# Patient Record
Sex: Female | Born: 1955 | Race: White | Hispanic: No | Marital: Married | Smoking: Never smoker
Health system: Southern US, Community
[De-identification: ages and names within clinical notes are randomized; demographics above are authoritative.]

## PROBLEM LIST (undated history)

## (undated) DIAGNOSIS — E785 Hyperlipidemia, unspecified: Secondary | ICD-10-CM

## (undated) DIAGNOSIS — K219 Gastro-esophageal reflux disease without esophagitis: Secondary | ICD-10-CM

## (undated) DIAGNOSIS — E119 Type 2 diabetes mellitus without complications: Secondary | ICD-10-CM

## (undated) DIAGNOSIS — A159 Respiratory tuberculosis unspecified: Secondary | ICD-10-CM

## (undated) DIAGNOSIS — I10 Essential (primary) hypertension: Secondary | ICD-10-CM

## (undated) DIAGNOSIS — M1990 Unspecified osteoarthritis, unspecified site: Secondary | ICD-10-CM

## (undated) DIAGNOSIS — E079 Disorder of thyroid, unspecified: Secondary | ICD-10-CM

## (undated) HISTORY — DX: Type 2 diabetes mellitus without complications: E11.9

## (undated) HISTORY — DX: Unspecified osteoarthritis, unspecified site: M19.90

## (undated) HISTORY — DX: Hyperlipidemia, unspecified: E78.5

## (undated) HISTORY — DX: Gastro-esophageal reflux disease without esophagitis: K21.9

## (undated) HISTORY — DX: Disorder of thyroid, unspecified: E07.9

## (undated) HISTORY — DX: Essential (primary) hypertension: I10

## (undated) HISTORY — DX: Respiratory tuberculosis unspecified: A15.9

## (undated) HISTORY — PX: TUBAL LIGATION: SHX77

---

## 2000-01-25 ENCOUNTER — Other Ambulatory Visit: Admission: RE | Admit: 2000-01-25 | Discharge: 2000-01-25 | Payer: Self-pay | Admitting: Obstetrics and Gynecology

## 2008-09-30 ENCOUNTER — Encounter: Admission: RE | Admit: 2008-09-30 | Discharge: 2008-09-30 | Payer: Self-pay | Admitting: Family Medicine

## 2010-01-12 DIAGNOSIS — J309 Allergic rhinitis, unspecified: Secondary | ICD-10-CM | POA: Insufficient documentation

## 2010-01-12 DIAGNOSIS — I071 Rheumatic tricuspid insufficiency: Secondary | ICD-10-CM | POA: Insufficient documentation

## 2010-01-19 ENCOUNTER — Encounter: Admission: RE | Admit: 2010-01-19 | Discharge: 2010-01-19 | Payer: Self-pay | Admitting: Family Medicine

## 2011-01-17 ENCOUNTER — Other Ambulatory Visit: Payer: Self-pay | Admitting: Family Medicine

## 2011-01-17 DIAGNOSIS — K219 Gastro-esophageal reflux disease without esophagitis: Secondary | ICD-10-CM | POA: Insufficient documentation

## 2011-01-17 DIAGNOSIS — E1142 Type 2 diabetes mellitus with diabetic polyneuropathy: Secondary | ICD-10-CM | POA: Insufficient documentation

## 2011-01-17 DIAGNOSIS — I10 Essential (primary) hypertension: Secondary | ICD-10-CM | POA: Insufficient documentation

## 2011-01-17 DIAGNOSIS — E039 Hypothyroidism, unspecified: Secondary | ICD-10-CM | POA: Insufficient documentation

## 2011-01-17 DIAGNOSIS — Z780 Asymptomatic menopausal state: Secondary | ICD-10-CM

## 2011-01-17 DIAGNOSIS — E119 Type 2 diabetes mellitus without complications: Secondary | ICD-10-CM | POA: Insufficient documentation

## 2011-01-17 DIAGNOSIS — Z1231 Encounter for screening mammogram for malignant neoplasm of breast: Secondary | ICD-10-CM

## 2011-01-26 ENCOUNTER — Other Ambulatory Visit: Payer: Self-pay

## 2011-01-26 ENCOUNTER — Ambulatory Visit: Payer: Self-pay

## 2011-01-31 ENCOUNTER — Ambulatory Visit
Admission: RE | Admit: 2011-01-31 | Discharge: 2011-01-31 | Disposition: A | Discharge status: Home / Self Care | Payer: PRIVATE HEALTH INSURANCE | Source: Ambulatory Visit | Attending: Family Medicine | Admitting: Family Medicine

## 2011-01-31 DIAGNOSIS — Z780 Asymptomatic menopausal state: Secondary | ICD-10-CM

## 2011-01-31 DIAGNOSIS — Z1231 Encounter for screening mammogram for malignant neoplasm of breast: Secondary | ICD-10-CM

## 2011-03-31 ENCOUNTER — Ambulatory Visit: Payer: PRIVATE HEALTH INSURANCE | Admitting: Family Medicine

## 2011-10-18 DIAGNOSIS — M19049 Primary osteoarthritis, unspecified hand: Secondary | ICD-10-CM | POA: Insufficient documentation

## 2012-01-19 ENCOUNTER — Other Ambulatory Visit: Payer: Self-pay | Admitting: Family Medicine

## 2012-01-19 DIAGNOSIS — Z1231 Encounter for screening mammogram for malignant neoplasm of breast: Secondary | ICD-10-CM

## 2012-02-02 ENCOUNTER — Ambulatory Visit: Payer: PRIVATE HEALTH INSURANCE

## 2012-02-13 ENCOUNTER — Ambulatory Visit
Admission: RE | Admit: 2012-02-13 | Discharge: 2012-02-13 | Disposition: A | Discharge status: Home / Self Care | Payer: PRIVATE HEALTH INSURANCE | Source: Ambulatory Visit | Attending: Family Medicine | Admitting: Family Medicine

## 2012-02-13 DIAGNOSIS — Z1231 Encounter for screening mammogram for malignant neoplasm of breast: Secondary | ICD-10-CM

## 2013-11-22 ENCOUNTER — Other Ambulatory Visit: Payer: Self-pay | Admitting: Family Medicine

## 2013-11-22 DIAGNOSIS — Z1231 Encounter for screening mammogram for malignant neoplasm of breast: Secondary | ICD-10-CM

## 2013-12-06 ENCOUNTER — Ambulatory Visit: Payer: PRIVATE HEALTH INSURANCE

## 2013-12-17 ENCOUNTER — Ambulatory Visit
Admission: RE | Admit: 2013-12-17 | Discharge: 2013-12-17 | Disposition: A | Discharge status: Home / Self Care | Payer: BC Managed Care – PPO | Source: Ambulatory Visit | Attending: Family Medicine | Admitting: Family Medicine

## 2013-12-17 DIAGNOSIS — Z1231 Encounter for screening mammogram for malignant neoplasm of breast: Secondary | ICD-10-CM

## 2016-03-25 ENCOUNTER — Other Ambulatory Visit: Payer: Self-pay | Admitting: Family Medicine

## 2016-03-25 DIAGNOSIS — Z1231 Encounter for screening mammogram for malignant neoplasm of breast: Secondary | ICD-10-CM

## 2016-03-25 DIAGNOSIS — M1711 Unilateral primary osteoarthritis, right knee: Secondary | ICD-10-CM | POA: Insufficient documentation

## 2016-03-25 DIAGNOSIS — M1712 Unilateral primary osteoarthritis, left knee: Secondary | ICD-10-CM | POA: Insufficient documentation

## 2016-04-08 ENCOUNTER — Ambulatory Visit: Payer: Self-pay

## 2016-04-15 ENCOUNTER — Ambulatory Visit: Payer: Self-pay

## 2016-04-20 ENCOUNTER — Ambulatory Visit: Payer: Self-pay

## 2016-04-28 ENCOUNTER — Ambulatory Visit
Admission: RE | Admit: 2016-04-28 | Discharge: 2016-04-28 | Disposition: A | Discharge status: Home / Self Care | Payer: BLUE CROSS/BLUE SHIELD | Source: Ambulatory Visit | Attending: Family Medicine | Admitting: Family Medicine

## 2016-04-28 DIAGNOSIS — Z1231 Encounter for screening mammogram for malignant neoplasm of breast: Secondary | ICD-10-CM

## 2017-04-19 ENCOUNTER — Other Ambulatory Visit: Payer: Self-pay | Admitting: Family Medicine

## 2017-04-19 DIAGNOSIS — Z1231 Encounter for screening mammogram for malignant neoplasm of breast: Secondary | ICD-10-CM

## 2017-10-20 LAB — LIPID PANEL
Cholesterol: 122 (ref 0–200)
HDL: 51 (ref 35–70)
LDL Cholesterol: 49
Triglycerides: 110 (ref 40–160)

## 2017-10-20 LAB — TSH: TSH: 2.45 (ref 0.41–5.90)

## 2017-10-20 LAB — BASIC METABOLIC PANEL: CREATININE: 0.8 (ref 0.5–1.1)

## 2017-10-24 LAB — MICROALBUMIN, URINE: MICROALB UR: NEGATIVE

## 2017-12-20 ENCOUNTER — Other Ambulatory Visit: Payer: Self-pay | Admitting: Nurse Practitioner

## 2017-12-20 DIAGNOSIS — Z1231 Encounter for screening mammogram for malignant neoplasm of breast: Secondary | ICD-10-CM

## 2018-01-08 ENCOUNTER — Ambulatory Visit: Payer: BLUE CROSS/BLUE SHIELD

## 2018-01-25 ENCOUNTER — Ambulatory Visit: Payer: Self-pay

## 2018-01-29 ENCOUNTER — Encounter: Payer: Self-pay | Admitting: Family Medicine

## 2018-01-29 ENCOUNTER — Other Ambulatory Visit: Payer: Self-pay

## 2018-01-29 ENCOUNTER — Ambulatory Visit (INDEPENDENT_AMBULATORY_CARE_PROVIDER_SITE_OTHER): Payer: 59 | Admitting: Family Medicine

## 2018-01-29 VITALS — BP 136/82 | HR 64 | Temp 98.3°F | Resp 16 | Ht 60.25 in | Wt 178.4 lb

## 2018-01-29 DIAGNOSIS — I10 Essential (primary) hypertension: Secondary | ICD-10-CM

## 2018-01-29 DIAGNOSIS — E039 Hypothyroidism, unspecified: Secondary | ICD-10-CM

## 2018-01-29 DIAGNOSIS — E119 Type 2 diabetes mellitus without complications: Secondary | ICD-10-CM

## 2018-01-29 DIAGNOSIS — E782 Mixed hyperlipidemia: Secondary | ICD-10-CM | POA: Diagnosis not present

## 2018-01-29 LAB — POCT GLYCOSYLATED HEMOGLOBIN (HGB A1C): HEMOGLOBIN A1C: 6.2

## 2018-01-29 MED ORDER — ROSUVASTATIN CALCIUM 10 MG PO TABS: 10.0000 mg | ORAL_TABLET | Freq: Every day | ORAL | 3 refills | Status: DC

## 2018-01-29 NOTE — Patient Instructions (Signed)
It was so good seeing you again! Thank you for establishing with my new practice and allowing me to continue caring for you. It means a lot to me.   Please schedule a follow up appointment with me in 6 months for Diabetic recheck.    Diabetes Mellitus and Nutrition When you have diabetes (diabetes mellitus), it is very important to have healthy eating habits because your blood sugar (glucose) levels are greatly affected by what you eat and drink. Eating healthy foods in the appropriate amounts, at about the same times every day, can help you:  Control your blood glucose.  Lower your risk of heart disease.  Improve your blood pressure.  Reach or maintain a healthy weight.  Every person with diabetes is different, and each person has different needs for a meal plan. Your health care provider may recommend that you work with a diet and nutrition specialist (dietitian) to make a meal plan that is best for you. Your meal plan may vary depending on factors such as:  The calories you need.  The medicines you take.  Your weight.  Your blood glucose, blood pressure, and cholesterol levels.  Your activity level.  Other health conditions you have, such as heart or kidney disease.  How do carbohydrates affect me? Carbohydrates affect your blood glucose level more than any other type of food. Eating carbohydrates naturally increases the amount of glucose in your blood. Carbohydrate counting is a method for keeping track of how many carbohydrates you eat. Counting carbohydrates is important to keep your blood glucose at a healthy level, especially if you use insulin or take certain oral diabetes medicines. It is important to know how many carbohydrates you can safely have in each meal. This is different for every person. Your dietitian can help you calculate how many carbohydrates you should have at each meal and for snack. Foods that contain carbohydrates include:  Bread, cereal, rice, pasta,  and crackers.  Potatoes and corn.  Peas, beans, and lentils.  Milk and yogurt.  Fruit and juice.  Desserts, such as cakes, cookies, ice cream, and candy.  How does alcohol affect me? Alcohol can cause a sudden decrease in blood glucose (hypoglycemia), especially if you use insulin or take certain oral diabetes medicines. Hypoglycemia can be a life-threatening condition. Symptoms of hypoglycemia (sleepiness, dizziness, and confusion) are similar to symptoms of having too much alcohol. If your health care provider says that alcohol is safe for you, follow these guidelines:  Limit alcohol intake to no more than 1 drink per day for nonpregnant women and 2 drinks per day for men. One drink equals 12 oz of beer, 5 oz of wine, or 1 oz of hard liquor.  Do not drink on an empty stomach.  Keep yourself hydrated with water, diet soda, or unsweetened iced tea.  Keep in mind that regular soda, juice, and other mixers may contain a lot of sugar and must be counted as carbohydrates.  What are tips for following this plan? Reading food labels  Start by checking the serving size on the label. The amount of calories, carbohydrates, fats, and other nutrients listed on the label are based on one serving of the food. Many foods contain more than one serving per package.  Check the total grams (g) of carbohydrates in one serving. You can calculate the number of servings of carbohydrates in one serving by dividing the total carbohydrates by 15. For example, if a food has 30 g of total carbohydrates, it  would be equal to 2 servings of carbohydrates.  Check the number of grams (g) of saturated and trans fats in one serving. Choose foods that have low or no amount of these fats.  Check the number of milligrams (mg) of sodium in one serving. Most people should limit total sodium intake to less than 2,300 mg per day.  Always check the nutrition information of foods labeled as "low-fat" or "nonfat". These  foods may be higher in added sugar or refined carbohydrates and should be avoided.  Talk to your dietitian to identify your daily goals for nutrients listed on the label. Shopping  Avoid buying canned, premade, or processed foods. These foods tend to be high in fat, sodium, and added sugar.  Shop around the outside edge of the grocery store. This includes fresh fruits and vegetables, bulk grains, fresh meats, and fresh dairy. Cooking  Use low-heat cooking methods, such as baking, instead of high-heat cooking methods like deep frying.  Cook using healthy oils, such as olive, canola, or sunflower oil.  Avoid cooking with butter, cream, or high-fat meats. Meal planning  Eat meals and snacks regularly, preferably at the same times every day. Avoid going long periods of time without eating.  Eat foods high in fiber, such as fresh fruits, vegetables, beans, and whole grains. Talk to your dietitian about how many servings of carbohydrates you can eat at each meal.  Eat 4-6 ounces of lean protein each day, such as lean meat, chicken, fish, eggs, or tofu. 1 ounce is equal to 1 ounce of meat, chicken, or fish, 1 egg, or 1/4 cup of tofu.  Eat some foods each day that contain healthy fats, such as avocado, nuts, seeds, and fish. Lifestyle   Check your blood glucose regularly.  Exercise at least 30 minutes 5 or more days each week, or as told by your health care provider.  Take medicines as told by your health care provider.  Do not use any products that contain nicotine or tobacco, such as cigarettes and e-cigarettes. If you need help quitting, ask your health care provider.  Work with a Veterinary surgeoncounselor or diabetes educator to identify strategies to manage stress and any emotional and social challenges. What are some questions to ask my health care provider?  Do I need to meet with a diabetes educator?  Do I need to meet with a dietitian?  What number can I call if I have questions?  When  are the best times to check my blood glucose? Where to find more information:  American Diabetes Association: diabetes.org/food-and-fitness/food  Academy of Nutrition and Dietetics: https://www.vargas.com/www.eatright.org/resources/health/diseases-and-conditions/diabetes  [employer] Institute of Diabetes and Digestive and Kidney Diseases (NIH): FindJewelers.czwww.niddk.nih.gov/health-information/diabetes/overview/diet-eating-physical-activity Summary  A healthy meal plan will help you control your blood glucose and maintain a healthy lifestyle.  Working with a diet and nutrition specialist (dietitian) can help you make a meal plan that is best for you.  Keep in mind that carbohydrates and alcohol have immediate effects on your blood glucose levels. It is important to count carbohydrates and to use alcohol carefully. This information is not intended to replace advice given to you by your health care provider. Make sure you discuss any questions you have with your health care provider. Document Released: 07/21/2005 Document Revised: 11/28/2016 Document Reviewed: 11/28/2016 Elsevier Interactive Patient Education  [employer]2018 Elsevier Inc.

## 2018-01-29 NOTE — Progress Notes (Signed)
Subjective  CC:  Chief Complaint  Patient presents with  . Establish Care    Check Hemoglobin A1c    HPI: Meagan Harrison is a 62 y.o. female who presents to [facility] today to establish care with me as a new patient. She is a former [facility] patient and is here to reestablish care with me today.  Last cpe 10/2017 - nl CMP, ldl at goa, and TSH at goal at that time. See media for labs  She has the following concerns or needs:  DM - well controlled and doing well. No concerns or complications. Admits to an occasional parasthesia in her feet at night w/o pain. No foot sores. No AEs from meds. Neg microalbuminuria. imms up to date: prevnar and pnuemovax  HTn f/u: doing fine on bb and ARB. No cp or sxs of chf. No AEs. Diet is fair. Weight is high but stable   Hyperlipidemia f/u: Patient presents for follow up of lipids. Lipids have been well controlled on meds w/o myalgias or side effects. Compliance with treatment thus far has been excellent. The patient does use medications that may worsen dyslipidemias (corticosteroids, progestins, anabolic steroids, diuretics, beta-blockers, amiodarone, cyclosporine, olanzapine). The patient exercises infrequently. The patient is not known to have coexisting coronary artery disease.     We updated and reviewed the patient's past history in detail and it is documented below.  Patient Active Problem List   Diagnosis Date Noted  . Mixed hyperlipidemia 01/29/2018    Priority: High  . Primary osteoarthritis of right knee 03/25/2016  . Osteoarthritis, hand 10/18/2011  . Diabetes mellitus type 2, controlled, without complications (HCC) 01/17/2011  . GERD (gastroesophageal reflux disease) 01/17/2011  . Essential hypertension 01/17/2011  . Acquired hypothyroidism 01/17/2011  . Allergic rhinitis 01/12/2010  . Tricuspid regurgitation 01/12/2010   Health Maintenance  Topic Date Due  . MAMMOGRAM  04/28/2017  . HIV Screening   01/30/2019 (Originally 02/22/1971)  . PNEUMOCOCCAL POLYSACCHARIDE VACCINE (2) 01/30/2024 (Originally 01/13/2015)  . OPHTHALMOLOGY EXAM  05/27/2018  . HEMOGLOBIN A1C  08/01/2018  . COLONOSCOPY  09/25/2018  . FOOT EXAM  01/30/2019  . PAP SMEAR  09/20/2019  . TETANUS/TDAP  01/18/2022  . INFLUENZA VACCINE  Completed  . Hepatitis C Screening  Completed   Immunization History  Administered Date(s) Administered  . Hepatitis B, ped/adol 02/28/1991  . Influenza Split 08/07/2000, 08/25/2011, 08/23/2015  . Influenza, Seasonal, Injecte, Preservative Fre 08/26/2014, 08/22/2015, 09/03/2016  . Influenza,inj,Quad PF,6+ Mos 08/25/2017  . PPD Test 11/08/2007  . Pneumococcal Conjugate-13 03/04/2014  . Pneumococcal Polysaccharide-23 01/12/2010  . Td 07/08/2005  . Tdap 01/19/2012   Current Meds  Medication Sig  . Calcium Citrate-Vitamin D 200-250 MG-UNIT TABS Take by mouth.  . cyclobenzaprine (FLEXERIL) 5 MG tablet TAKE 2 TABLETS BY MOUTH THREE TIMES DAILY AS NEEDED FOR MUSCLE SPASMS  . diclofenac sodium (VOLTAREN) 1 % GEL APPLY 1 GRAMS TO affected joints  FOUR TIMES DAILY AS DIRECTED  . famotidine (PEPCID) 20 MG tablet Take 20 mg by mouth 2 (two) times daily.  . fexofenadine (ALLEGRA) 60 MG tablet Take by mouth.  . fluticasone (FLONASE) 50 MCG/ACT nasal spray INSTILL 1 TO 2 SPRAYS IN EACH NOSTRIL ONCE TO TWICE DAILY  . glipiZIDE (GLUCOTROL XL) 2.5 MG 24 hr tablet TAKE 1 TABLET(2.5 MG) BY MOUTH DAILY  . glucosamine-chondroitin 500-400 MG tablet Take 2 tablets by mouth daily. 1500-200, Called Move ahead  . ibuprofen (ADVIL,MOTRIN) 200 MG tablet Take by mouth.  Marland Kitchen  levothyroxine (SYNTHROID, LEVOTHROID) 50 MCG tablet TAKE 1 TABLET BY MOUTH DAILY  . losartan (COZAAR) 100 MG tablet TAKE 1 TABLET BY MOUTH EVERY DAY  . metFORMIN (GLUCOPHAGE) 1000 MG tablet TAKE 1 TABLET(1000 MG) BY MOUTH TWICE DAILY WITH MEALS  . metoprolol tartrate (LOPRESSOR) 50 MG tablet TAKE 1 TABLET BY MOUTH TWICE DAILY  . montelukast  (SINGULAIR) 10 MG tablet TAKE 1 TABLET BY MOUTH EVERY NIGHT AT BEDTIME  . Multiple Vitamin (MULTIVITAMIN) tablet Take by mouth.  . rosuvastatin (CRESTOR) 10 MG tablet Take 1 tablet (10 mg total) by mouth at bedtime.  . [DISCONTINUED] rosuvastatin (CRESTOR) 10 MG tablet Take by mouth.    Allergies: Patient is allergic to lisinopril. Past Medical History Patient  has a past medical history of Arthritis, Diabetes mellitus (HCC), GERD (gastroesophageal reflux disease), Hyperlipidemia, Hypertension, Thyroid disease, and Tuberculosis. Past Surgical History Patient  has a past surgical history that includes Cesarean section. Family History: Patient family history includes Diabetes in her father; Hypertension in her father; Rectal cancer in her mother; Stroke in her father. Social History:  Patient  reports that she has never smoked. She has never used smokeless tobacco. She reports that she drinks alcohol. She reports that she does not use drugs.  Review of Systems: Constitutional: negative for fever or malaise Ophthalmic: negative for photophobia, double vision or loss of vision Cardiovascular: negative for chest pain, dyspnea on exertion, or new LE swelling Respiratory: negative for SOB or persistent cough Gastrointestinal: negative for abdominal pain, change in bowel habits or melena Genitourinary: negative for dysuria or gross hematuria Musculoskeletal: negative for new gait disturbance or muscular weakness Integumentary: negative for new or persistent rashes Neurological: negative for TIA or stroke symptoms Psychiatric: negative for SI or delusions Allergic/Immunologic: negative for hives  Patient Care Team    Relationship Specialty Notifications Start End  Willow Ora, MD PCP - General Family Medicine  01/29/18   Sallye Lat, MD Consulting Physician Ophthalmology  01/29/18   Marshell Levan    01/29/18    Comment: Dentist    Objective  Vitals: BP 136/82   Pulse 64   Temp  98.3 F (36.8 C) (Oral)   Resp 16   Ht 5' 0.25" (1.53 m)   Wt 178 lb 6.4 oz (80.9 kg)   SpO2 98%   BMI 34.55 kg/m  General:  Well developed, well nourished, no acute distress  Psych:  Alert and oriented,normal mood and affect HEENT:  Normocephalic, atraumatic, non-icteric sclera, PERRL, oropharynx is without mass or exudate, supple neck without adenopathy, mass or thyromegaly Cardiovascular:  RRR without gallop, rub or murmur, nondisplaced PMI Respiratory:  Good breath sounds bilaterally, CTAB with normal respiratory effort Gastrointestinal: normal bowel sounds, soft, non-tender, no noted masses. No HSM MSK: no deformities, contusions. Joints are without erythema or swelling Skin:  Warm, no rashes or suspicious lesions noted Neurologic:    Mental status is normal. Gross motor and sensory exams are normal. Normal gait Diabetic Foot Exam: Appearance - no lesions, ulcers or calluses Skin - no sigificant pallor or erythema Monofilament testing - sensitive bilaterally in following locations:  Right - Great toe, medial, central, lateral ball and posterior foot intact  Left - Great toe, medial, central, lateral ball and posterior foot intact Pulses - +2 distally bilaterally  Lab Results  Component Value Date   HGBA1C 6.2 01/29/2018    Office Visit on 01/29/2018  Component Date Value Ref Range Status  . Microalb, Ur 10/24/2017 neg   Final  .  Hemoglobin A1C 01/29/2018 6.2   Final  . Creatinine 10/20/2017 0.8  0.5 - 1.1 Final  . Triglycerides 10/20/2017 110  40 - 160 Final  . Cholesterol 10/20/2017 122  0 - 200 Final  . HDL 10/20/2017 51  35 - 70 Final  . LDL Cholesterol 10/20/2017 49   Final  . TSH 10/20/2017 2.45  0.41 - 5.90 Final    Assessment  1. Controlled type 2 diabetes mellitus without complication, without long-term current use of insulin (HCC)   2. Essential hypertension   3. Mixed hyperlipidemia   4. Acquired hypothyroidism      Plan   Diabetes: This medical  condition is well controlled. There are no signs of complications, medication side effects, or red flags. Patient is instructed to continue the current treatment plan without change in therapies or medications. May be developing mild peripheral neuropathy; will monitor. She does daily foot exam and care.   HTN is controlled as well. No med changes. Nl renal fxn and electrolytes.   Lipids are at goal. On crestor with last LDL 49. Nl liver function.   Thyroid is controlled. No med changes.   Follow up:  Return in about 6 months (around 08/01/2018) for follow up of diabetes and hypertension.  Commons side effects, risks, benefits, and alternatives for medications and treatment plan prescribed today were discussed, and the patient expressed understanding of the given instructions. Patient is instructed to call or message via MyChart if he/she has any questions or concerns regarding our treatment plan. No barriers to understanding were identified. We discussed Red Flag symptoms and signs in detail. Patient expressed understanding regarding what to do in case of urgent or emergency type symptoms.   Medication list was reconciled, printed and provided to the patient in AVS. Patient instructions and summary information was reviewed with the patient as documented in the AVS. This note was prepared with assistance of Dragon voice recognition software. Occasional wrong-word or sound-a-like substitutions may have occurred due to the inherent limitations of voice recognition software  Orders Placed This Encounter  Procedures  . Microalbumin, urine  . Basic metabolic panel  . Lipid panel  . TSH  . POCT glycosylated hemoglobin (Hb A1C)   Meds ordered this encounter  Medications  . rosuvastatin (CRESTOR) 10 MG tablet    Sig: Take 1 tablet (10 mg total) by mouth at bedtime.    Dispense:  90 tablet    Refill:  3

## 2018-02-13 ENCOUNTER — Encounter: Payer: Self-pay | Admitting: Family Medicine

## 2018-02-14 ENCOUNTER — Other Ambulatory Visit: Payer: Self-pay | Admitting: Emergency Medicine

## 2018-02-14 MED ORDER — LEVOTHYROXINE SODIUM 50 MCG PO TABS: 50.0000 ug | ORAL_TABLET | Freq: Every day | ORAL | 3 refills | Status: DC

## 2018-02-14 MED ORDER — METOPROLOL TARTRATE 50 MG PO TABS: 50.0000 mg | ORAL_TABLET | Freq: Two times a day (BID) | ORAL | 3 refills | Status: DC

## 2018-02-14 MED ORDER — METFORMIN HCL 1000 MG PO TABS: ORAL_TABLET | ORAL | 3 refills | Status: DC

## 2018-02-14 MED ORDER — LOSARTAN POTASSIUM 100 MG PO TABS: 100.0000 mg | ORAL_TABLET | Freq: Every day | ORAL | 3 refills | Status: DC

## 2018-02-14 MED ORDER — MONTELUKAST SODIUM 10 MG PO TABS: 10.0000 mg | ORAL_TABLET | Freq: Every day | ORAL | 3 refills | Status: DC

## 2018-02-14 MED ORDER — GLIPIZIDE ER 2.5 MG PO TB24: ORAL_TABLET | ORAL | 3 refills | Status: DC

## 2018-04-24 LAB — HM DIABETES EYE EXAM

## 2018-05-07 ENCOUNTER — Encounter: Payer: Self-pay | Admitting: Emergency Medicine

## 2018-08-01 ENCOUNTER — Encounter: Payer: Self-pay | Admitting: Family Medicine

## 2018-08-01 ENCOUNTER — Other Ambulatory Visit: Payer: Self-pay

## 2018-08-01 ENCOUNTER — Ambulatory Visit: Payer: 59 | Admitting: Family Medicine

## 2018-08-01 VITALS — BP 132/90 | HR 81 | Temp 98.2°F | Ht 60.25 in | Wt 180.6 lb

## 2018-08-01 DIAGNOSIS — E782 Mixed hyperlipidemia: Secondary | ICD-10-CM

## 2018-08-01 DIAGNOSIS — Z23 Encounter for immunization: Secondary | ICD-10-CM

## 2018-08-01 DIAGNOSIS — M65332 Trigger finger, left middle finger: Secondary | ICD-10-CM | POA: Diagnosis not present

## 2018-08-01 DIAGNOSIS — I10 Essential (primary) hypertension: Secondary | ICD-10-CM

## 2018-08-01 DIAGNOSIS — E119 Type 2 diabetes mellitus without complications: Secondary | ICD-10-CM | POA: Diagnosis not present

## 2018-08-01 LAB — COMPREHENSIVE METABOLIC PANEL
ALBUMIN: 4.3 g/dL (ref 3.5–5.2)
ALT: 18 U/L (ref 0–35)
AST: 15 U/L (ref 0–37)
Alkaline Phosphatase: 86 U/L (ref 39–117)
BUN: 15 mg/dL (ref 6–23)
CO2: 27 mEq/L (ref 19–32)
CREATININE: 0.8 mg/dL (ref 0.40–1.20)
Calcium: 9.5 mg/dL (ref 8.4–10.5)
Chloride: 101 mEq/L (ref 96–112)
GFR: 77.39 mL/min (ref >60.00)
Glucose, Bld: 145 mg/dL — ABNORMAL HIGH (ref 70–99)
Potassium: 4 mEq/L (ref 3.5–5.1)
SODIUM: 139 meq/L (ref 135–145)
TOTAL PROTEIN: 6.7 g/dL (ref 6.0–8.3)
Total Bilirubin: 0.5 mg/dL (ref 0.2–1.2)

## 2018-08-01 LAB — LIPID PANEL
Cholesterol: 116 mg/dL (ref 0–200)
HDL: 47.8 mg/dL (ref >39.00)
LDL CALC: 38 mg/dL (ref 0–99)
NONHDL: 68.01
Total CHOL/HDL Ratio: 2
Triglycerides: 148 mg/dL (ref 0.0–149.0)
VLDL: 29.6 mg/dL (ref 0.0–40.0)

## 2018-08-01 LAB — CBC WITH DIFFERENTIAL/PLATELET
Basophils Absolute: 0.1 10*3/uL (ref 0.0–0.1)
Basophils Relative: 1.1 % (ref 0.0–3.0)
EOS ABS: 0.2 10*3/uL (ref 0.0–0.7)
Eosinophils Relative: 2.6 % (ref 0.0–5.0)
HCT: 38.9 % (ref 36.0–46.0)
Hemoglobin: 13.5 g/dL (ref 12.0–15.0)
Lymphocytes Relative: 24.6 % (ref 12.0–46.0)
Lymphs Abs: 1.7 10*3/uL (ref 0.7–4.0)
MCHC: 34.7 g/dL (ref 30.0–36.0)
MCV: 97 fl (ref 78.0–100.0)
MONO ABS: 0.5 10*3/uL (ref 0.1–1.0)
Monocytes Relative: 6.7 % (ref 3.0–12.0)
Neutro Abs: 4.4 10*3/uL (ref 1.4–7.7)
Neutrophils Relative %: 65 % (ref 43.0–77.0)
Platelets: 271 10*3/uL (ref 150.0–400.0)
RBC: 4.01 Mil/uL (ref 3.87–5.11)
RDW: 12.9 % (ref 11.5–15.5)
WBC: 6.8 10*3/uL (ref 4.0–10.5)

## 2018-08-01 LAB — HEMOGLOBIN A1C: HEMOGLOBIN A1C: 6.9 % — AB (ref 4.6–6.5)

## 2018-08-01 LAB — TSH: TSH: 2.62 u[IU]/mL (ref 0.35–4.50)

## 2018-08-01 NOTE — Progress Notes (Signed)
Subjective  CC:  Chief Complaint  Patient presents with  . Diabetes    Last A1c 01/29/2018, doing well, wants flu shot today   . Hypertension  . Hyperlipidemia  . Hand Pain    middle finger is locking; types all day at work    HPI: Meagan Harrison is a 62 y.o. female who presents to the office today for follow up of diabetes and problems listed above in the chief complaint.   Diabetes follow up: Her diabetic control is reported as Worse.  She denies symptoms of hyperglycemia but admits that her diet is weight is up a few pounds.  Mild symptoms of peripheral neuropathy persist.  They are not bothersome.  They are occurring more often. She denies exertional CP or SOB or symptomatic hypoglycemia. She denies foot sores.  Hypertension: Denies chest pain or lower extremity tolerating medications.  Hyperlipidemia follow-up: Has been well controlled on statin.  No myalgias  Hand pain, has mild osteoarthritis.  Now with mild triggering intermittently with left middle finger.  Has used Voltaren gel with good results.  No trauma.  No red swollen joints.  Health maintenance: Due for influenza vaccination today.  Still needs to go get mammogram.  Needs to reschedule.  She has a number for appointment.  Assessment  1. Controlled type 2 diabetes mellitus without complication, without long-term current use of insulin (HCC)   2. Essential hypertension   3. Mixed hyperlipidemia   4. Trigger finger, left middle finger   5. Need for influenza vaccination      Plan   Diabetes has been well controlled.  Check cotesting not currently available today.  Will adjust medications A1c is elevated.  Diabetic diet.  Will treat.  Discussed management of mild peripheral neuropathy symptoms.  Foot care.  Normal exam today.  Update flu shot today.  Hypertension and hyperlipidemia follow-up: Well-controlled.  Recheck lab work today.  No change in medications at this time.  Health maintenance: Patient to  reschedule her mammogram.  Trigger finger: Education given.  Start massage, ice, Aleve x1 to 2 weeks.  Splint as tolerated.  Follow-up if worsening.  Follow up: Return in about 3 months (around 10/31/2018) for complete physical, follow up of diabetes and hypertension.. Orders Placed This Encounter  Procedures  . Flu Vaccine QUAD 36+ mos IM  . Hemoglobin A1c  . CBC with Differential/Platelet  . Comprehensive metabolic panel  . Lipid panel  . TSH   No orders of the defined types were placed in this encounter.     Immunization History  Administered Date(s) Administered  . Hepatitis B, ped/adol 02/28/1991  . Influenza Split 08/07/2000, 08/25/2011, 08/23/2015  . Influenza, Seasonal, Injecte, Preservative Fre 08/26/2014, 08/22/2015, 09/03/2016  . Influenza,inj,Quad PF,6+ Mos 08/25/2017, 08/01/2018  . PPD Test 11/08/2007  . Pneumococcal Conjugate-13 03/04/2014  . Pneumococcal Polysaccharide-23 01/12/2010  . Td 07/08/2005  . Tdap 01/19/2012    Diabetes Related Lab Review: Lab Results  Component Value Date   HGBA1C 6.2 01/29/2018    Lab Results  Component Value Date   MICROALBUR neg 10/24/2017   Lab Results  Component Value Date   CREATININE 0.8 10/20/2017   Lab Results  Component Value Date   CHOL 122 10/20/2017   Lab Results  Component Value Date   HDL 51 10/20/2017   Lab Results  Component Value Date   LDLCALC 49 10/20/2017   Lab Results  Component Value Date   TRIG 110 10/20/2017   No results found for: CHOLHDL  No results found for: LDLDIRECT The ASCVD Risk score Denman George DC Jr., et al., 2013) failed to calculate for the following reasons:   The valid total cholesterol range is 130 to 320 mg/dL I have reviewed the PMH, Fam and Soc history. Patient Active Problem List   Diagnosis Date Noted  . Mixed hyperlipidemia 01/29/2018    Priority: High  . Primary osteoarthritis of right knee 03/25/2016  . Osteoarthritis, hand 10/18/2011    Overview:  righ 1st MCP  joint   . Diabetes mellitus type 2, controlled, without complications (HCC) 01/17/2011  . GERD (gastroesophageal reflux disease) 01/17/2011  . Essential hypertension 01/17/2011  . Acquired hypothyroidism 01/17/2011  . Allergic rhinitis 01/12/2010    Overview:  10/1 IMO update   . Tricuspid regurgitation 01/12/2010    Overview:  Mild Tricuspid Regurgitation - echo at [facility]; no further eval needed     Social History: Patient  reports that she has never smoked. She has never used smokeless tobacco. She reports that she drinks alcohol. She reports that she does not use drugs.  Review of Systems: Ophthalmic: negative for eye pain, loss of vision or double vision Cardiovascular: negative for chest pain Respiratory: negative for SOB or persistent cough Gastrointestinal: negative for abdominal pain Genitourinary: negative for dysuria or gross hematuria MSK: negative for foot lesions Neurologic: negative for weakness or gait disturbance  Wt Readings from Last 3 Encounters:  08/01/18 180 lb 9.6 oz (81.9 kg)  01/29/18 178 lb 6.4 oz (80.9 kg)    Objective  Vitals: BP 132/90   Pulse 81   Temp 98.2 F (36.8 C)   Ht 5' 0.25" (1.53 m)   Wt 180 lb 9.6 oz (81.9 kg)   SpO2 98%   BMI 34.98 kg/m  General: well appearing, no acute distress  Psych:  Alert and oriented, normal mood and affect HEENT:  Normocephalic, atraumatic, moist mucous membranes, supple neck  Cardiovascular:  Nl S1 and S2, RRR without murmur, gallop or rub. no edema Respiratory:  Good breath sounds bilaterally, CTAB with normal effort, no rales Gastrointestinal: normal BS, soft, nontender Skin:  Warm, no rashes Neurologic:   Mental status is normal. normal gait Foot exam: no erythema, pallor, or cyanosis visible nl proprioception and sensation to monofilament testing bilaterally, +2 distal pulses bilaterally Musculoskeletal: Bilateral hands normal.  Left middle MCP with palpable nodule and mild triggering, mild  tenderness    Diabetic education: ongoing education regarding chronic disease management for diabetes was given today. We continue to reinforce the ABC's of diabetic management: A1c (<7 or 8 dependent upon patient), tight blood pressure control, and cholesterol management with goal LDL < 100 minimally. We discuss diet strategies, exercise recommendations, medication options and possible side effects. At each visit, we review recommended immunizations and preventive care recommendations for diabetics and stress that good diabetic control can prevent other problems. See below for this patient's data.    Commons side effects, risks, benefits, and alternatives for medications and treatment plan prescribed today were discussed, and the patient expressed understanding of the given instructions. Patient is instructed to call or message via MyChart if he/she has any questions or concerns regarding our treatment plan. No barriers to understanding were identified. We discussed Red Flag symptoms and signs in detail. Patient expressed understanding regarding what to do in case of urgent or emergency type symptoms.   Medication list was reconciled, printed and provided to the patient in AVS. Patient instructions and summary information was reviewed with the patient as documented  in the AVS. This note was prepared with assistance of Dragon voice recognition software. Occasional wrong-word or sound-a-like substitutions may have occurred due to the inherent limitations of voice recognition software

## 2018-08-01 NOTE — Patient Instructions (Addendum)
Please return in 3 months for diabetes follow up and for your annual complete physical; please come fasting.  I will release your lab results to you on your MyChart account with further instructions. Please reply with any questions.  Improve your diet!  Wear the finger splint as needed, massage, ice and try alleve twice a day for 1-2 weeks. If it is worsening, return for a steroid injection.  If you have any questions or concerns, please don't hesitate to send me a message via MyChart or call the office at [phone]. Thank you for visiting with Meagan Harrison today! It's our pleasure caring for you.   Trigger Finger Trigger finger (stenosing tenosynovitis) is a condition that causes a finger to get stuck in a bent position. Each finger has a tough, cord-like tissue that connects muscle to bone (tendon), and each tendon is surrounded by a tunnel of tissue (tendon sheath). To move your finger, your tendon needs to slide freely through the sheath. Trigger finger happens when the tendon or the sheath thickens, making it difficult to move your finger. Trigger finger can affect any finger or a thumb. It may affect more than one finger. Mild cases may clear up with rest and medicine. Severe cases require more treatment. What are the causes? Trigger finger is caused by a thickened finger tendon or tendon sheath. The cause of this thickening is not known. What increases the risk? The following factors may make you more likely to develop this condition:  Doing activities that require a strong grip.  Having rheumatoid arthritis, gout, or diabetes.  Being 31-38 years old.  Being a woman.  What are the signs or symptoms? Symptoms of this condition include:  Pain when bending or straightening your finger.  Tenderness or swelling where your finger attaches to the palm of your hand.  A lump in the palm of your hand or on the inside of your finger.  Hearing a popping sound when you try to straighten your  finger.  Feeling a popping, catching, or locking sensation when you try to straighten your finger.  Being unable to straighten your finger.  How is this diagnosed? This condition is diagnosed based on your symptoms and a physical exam. How is this treated? This condition may be treated by:  Resting your finger and avoiding activities that make symptoms worse.  Wearing a finger splint to keep your finger in a slightly bent position.  Taking NSAIDs to relieve pain and swelling.  Injecting medicine (steroids) into the tendon sheath to reduce swelling and irritation. Injections may need to be repeated.  Having surgery to open the tendon sheath. This may be done if other treatments do not work and you cannot straighten your finger. You may need physical therapy after surgery.  Follow these instructions at home:  Use moist heat to help reduce pain and swelling as told by your health care provider.  Rest your finger and avoid activities that make pain worse. Return to normal activities as told by your health care provider.  If you have a splint, wear it as told by your health care provider.  Take over-the-counter and prescription medicines only as told by your health care provider.  Keep all follow-up visits as told by your health care provider. This is important. Contact a health care provider if:  Your symptoms are not improving with home care. Summary  Trigger finger (stenosing tenosynovitis) causes your finger to get stuck in a bent position, and it can make it difficult and  painful to straighten your finger.  This condition develops when a finger tendon or tendon sheath thickens.  Treatment starts with resting, wearing a splint, and taking NSAIDs.  In severe cases, surgery to open the tendon sheath may be needed. This information is not intended to replace advice given to you by your health care provider. Make sure you discuss any questions you have with your health care  provider. Document Released: 08/13/2004 Document Revised: 10/04/2016 Document Reviewed: 10/04/2016 Elsevier Interactive Patient Education  2017 [employer].

## 2018-09-17 ENCOUNTER — Other Ambulatory Visit: Payer: Self-pay | Admitting: Family Medicine

## 2018-09-17 NOTE — Telephone Encounter (Signed)
Received and reviewed medication refill request.  Request is appropriate and was approved.  Please see medication orders for details.  

## 2018-10-18 ENCOUNTER — Other Ambulatory Visit: Payer: Self-pay | Admitting: Family Medicine

## 2018-11-02 ENCOUNTER — Encounter: Payer: 59 | Admitting: Family Medicine

## 2018-11-14 ENCOUNTER — Other Ambulatory Visit: Payer: Self-pay

## 2018-11-14 ENCOUNTER — Other Ambulatory Visit (HOSPITAL_COMMUNITY)
Admission: RE | Admit: 2018-11-14 | Discharge: 2018-11-14 | Disposition: A | Discharge status: Home / Self Care | Payer: 59 | Source: Ambulatory Visit | Attending: Family Medicine | Admitting: Family Medicine

## 2018-11-14 ENCOUNTER — Encounter: Payer: Self-pay | Admitting: Family Medicine

## 2018-11-14 ENCOUNTER — Ambulatory Visit (INDEPENDENT_AMBULATORY_CARE_PROVIDER_SITE_OTHER): Payer: 59 | Admitting: Family Medicine

## 2018-11-14 VITALS — BP 136/80 | HR 88 | Temp 98.9°F | Resp 16 | Ht 60.0 in | Wt 178.4 lb

## 2018-11-14 DIAGNOSIS — Z23 Encounter for immunization: Secondary | ICD-10-CM

## 2018-11-14 DIAGNOSIS — I071 Rheumatic tricuspid insufficiency: Secondary | ICD-10-CM

## 2018-11-14 DIAGNOSIS — I10 Essential (primary) hypertension: Secondary | ICD-10-CM

## 2018-11-14 DIAGNOSIS — Z1239 Encounter for other screening for malignant neoplasm of breast: Secondary | ICD-10-CM

## 2018-11-14 DIAGNOSIS — E119 Type 2 diabetes mellitus without complications: Secondary | ICD-10-CM | POA: Diagnosis not present

## 2018-11-14 DIAGNOSIS — E782 Mixed hyperlipidemia: Secondary | ICD-10-CM

## 2018-11-14 DIAGNOSIS — Z124 Encounter for screening for malignant neoplasm of cervix: Secondary | ICD-10-CM | POA: Insufficient documentation

## 2018-11-14 DIAGNOSIS — E039 Hypothyroidism, unspecified: Secondary | ICD-10-CM

## 2018-11-14 DIAGNOSIS — Z0000 Encounter for general adult medical examination without abnormal findings: Secondary | ICD-10-CM | POA: Diagnosis not present

## 2018-11-14 LAB — CBC WITH DIFFERENTIAL/PLATELET
Basophils Absolute: 0 10*3/uL (ref 0.0–0.1)
Basophils Relative: 0.7 % (ref 0.0–3.0)
EOS PCT: 1.9 % (ref 0.0–5.0)
Eosinophils Absolute: 0.1 10*3/uL (ref 0.0–0.7)
HCT: 39.3 % (ref 36.0–46.0)
HEMOGLOBIN: 13.4 g/dL (ref 12.0–15.0)
Lymphocytes Relative: 27.6 % (ref 12.0–46.0)
Lymphs Abs: 1.6 10*3/uL (ref 0.7–4.0)
MCHC: 34 g/dL (ref 30.0–36.0)
MCV: 99 fl (ref 78.0–100.0)
MONOS PCT: 6.5 % (ref 3.0–12.0)
Monocytes Absolute: 0.4 10*3/uL (ref 0.1–1.0)
Neutro Abs: 3.8 10*3/uL (ref 1.4–7.7)
Neutrophils Relative %: 63.3 % (ref 43.0–77.0)
Platelets: 266 10*3/uL (ref 150.0–400.0)
RBC: 3.97 Mil/uL (ref 3.87–5.11)
RDW: 13 % (ref 11.5–15.5)
WBC: 5.9 10*3/uL (ref 4.0–10.5)

## 2018-11-14 LAB — LIPID PANEL
Cholesterol: 116 mg/dL (ref 0–200)
HDL: 47 mg/dL (ref >39.00)
LDL Cholesterol: 47 mg/dL (ref 0–99)
NonHDL: 69.4
Total CHOL/HDL Ratio: 2
Triglycerides: 113 mg/dL (ref 0.0–149.0)
VLDL: 22.6 mg/dL (ref 0.0–40.0)

## 2018-11-14 LAB — POCT GLYCOSYLATED HEMOGLOBIN (HGB A1C): Hemoglobin A1C: 6.2 % — AB (ref 4.0–5.6)

## 2018-11-14 LAB — COMPREHENSIVE METABOLIC PANEL
ALT: 18 U/L (ref 0–35)
AST: 20 U/L (ref 0–37)
Albumin: 4.2 g/dL (ref 3.5–5.2)
Alkaline Phosphatase: 62 U/L (ref 39–117)
BUN: 17 mg/dL (ref 6–23)
CO2: 27 mEq/L (ref 19–32)
Calcium: 9.8 mg/dL (ref 8.4–10.5)
Chloride: 102 mEq/L (ref 96–112)
Creatinine, Ser: 0.88 mg/dL (ref 0.40–1.20)
GFR: 69.27 mL/min (ref >60.00)
Glucose, Bld: 123 mg/dL — ABNORMAL HIGH (ref 70–99)
POTASSIUM: 4.1 meq/L (ref 3.5–5.1)
Sodium: 140 mEq/L (ref 135–145)
Total Bilirubin: 0.5 mg/dL (ref 0.2–1.2)
Total Protein: 6.8 g/dL (ref 6.0–8.3)

## 2018-11-14 LAB — MICROALBUMIN / CREATININE URINE RATIO
Creatinine,U: 48.1 mg/dL
Microalb Creat Ratio: 1.5 mg/g (ref 0.0–30.0)
Microalb, Ur: <0.7 mg/dL (ref 0.0–1.9)

## 2018-11-14 LAB — TSH: TSH: 2.66 u[IU]/mL (ref 0.35–4.50)

## 2018-11-14 MED ORDER — METOPROLOL TARTRATE 50 MG PO TABS: 50.0000 mg | ORAL_TABLET | Freq: Two times a day (BID) | ORAL | 3 refills | Status: DC

## 2018-11-14 MED ORDER — METFORMIN HCL 1000 MG PO TABS: ORAL_TABLET | ORAL | 0 refills | Status: DC

## 2018-11-14 NOTE — Patient Instructions (Addendum)
Please return in 6 months for diabetes and blood pressure follow up.   I will release your lab results to you on your MyChart account with further instructions. Please reply with any questions.  Your diabetes is doing well!  Please call Dr. Rogers Seeds office to set up your colonoscopy.  [facility]    If you have any questions or concerns, please don't hesitate to send me a message via MyChart or call the office at [phone]. Thank you for visiting with [place] today! It's our pleasure caring for you.  Please do these things to maintain good health!   Exercise at least 30-45 minutes a day,  4-5 days a week.   Eat a low-fat diet with lots of fruits and vegetables, up to 7-9 servings per day.  Drink plenty of water daily. Try to drink 8 8oz glasses per day.  Seatbelts can save your life. Always wear your seatbelt.  Place Smoke Detectors on every level of your home and check batteries every year.  Schedule an appointment with an eye doctor for an eye exam every 1-2 years  Safe sex - use condoms to protect yourself from STDs if you could be exposed to these types of infections. Use birth control if you do not want to become pregnant and are sexually active.  Avoid heavy alcohol use. If you drink, keep it to less than 2 drinks/day and not every day.  Health Care Power of Attorney.  Choose someone you trust that could speak for you if you became unable to speak for yourself.  Depression is common in our stressful world.If you're feeling down or losing interest in things you normally enjoy, please come in for a visit.  If anyone is threatening or hurting you, please get help. Physical or Emotional Violence is never OK.

## 2018-11-14 NOTE — Progress Notes (Signed)
Subjective  Chief Complaint  Patient presents with  . Annual Exam  . Diabetes  . Hypertension    HPI: Meagan Harrison is a 63 y.o. female who presents to [facility] today for a Female Wellness Visit. She also has the concerns and/or needs as listed above in the chief complaint. These will be addressed in addition to the Health Maintenance Visit.   Wellness Visit: annual visit with health maintenance review and exam without Pap   HM: due mammo; due colonoscopy. Pap due in November. imms up to date. shingrix candidate.  Diet: Eats a healthy diet.  Would like to lose weight.  Discussed low-fat diet.   Chronic disease f/u and/or acute problem visit: (deemed necessary to be done in addition to the wellness visit):  Hypertension:Feeling well. Taking medications w/o adverse effects. No symptoms of CHF, angina; no palpitations, sob, cp or lower extremity edema. Compliant with meds.   Diabetes follow up: Her diabetic control is reported as Improved.  Eating well.  No symptoms of hyperglycemia.  Tolerating medications.  Needs refill on metformin.  Eye exam is up-to-date.  Immunizations are up-to-date. She denies exertional CP or SOB or symptomatic hypoglycemia. She denies foot sores or paresthesias.  Hyperlipidemia on statin.  Due for fasting lipids.  Has been very well controlled.  No myalgias  Acquired hypothyroidism: Euthyroid clinically.  Compliant with medications. Immunization History  Administered Date(s) Administered  . Hepatitis B, ped/adol 02/28/1991  . Influenza Split 08/07/2000, 08/25/2011, 08/23/2015  . Influenza, Seasonal, Injecte, Preservative Fre 08/26/2014, 08/22/2015, 09/03/2016  . Influenza,inj,Quad PF,6+ Mos 08/25/2017, 08/01/2018  . PPD Test 11/08/2007  . Pneumococcal Conjugate-13 03/04/2014  . Pneumococcal Polysaccharide-23 01/12/2010  . Td 07/08/2005  . Tdap 01/19/2012    Diabetes Related Lab Review: Lab Results  Component Value  Date   HGBA1C 6.2 (A) 11/14/2018   HGBA1C 6.9 (H) 08/01/2018   HGBA1C 6.2 01/29/2018     Lab Results  Component Value Date   MICROALBUR neg 10/24/2017   Lab Results  Component Value Date   CREATININE 0.80 08/01/2018   BUN 15 08/01/2018   NA 139 08/01/2018   K 4.0 08/01/2018   CL 101 08/01/2018   CO2 27 08/01/2018   Lab Results  Component Value Date   CHOL 116 08/01/2018   CHOL 122 10/20/2017   Lab Results  Component Value Date   HDL 47.80 08/01/2018   HDL 51 10/20/2017   Lab Results  Component Value Date   LDLCALC 38 08/01/2018   LDLCALC 49 10/20/2017   Lab Results  Component Value Date   TRIG 148.0 08/01/2018   TRIG 110 10/20/2017   Lab Results  Component Value Date   CHOLHDL 2 08/01/2018   No results found for: LDLDIRECT The ASCVD Risk score Denman George DC Jr., et al., 2013) failed to calculate for the following reasons:   The valid total cholesterol range is 130 to 320 mg/dL  Assessment  1. Annual physical exam   2. Controlled type 2 diabetes mellitus without complication, without long-term current use of insulin (HCC)   3. Mixed hyperlipidemia   4. Essential hypertension   5. Acquired hypothyroidism   6. Tricuspid valve insufficiency, unspecified etiology      Plan  Female Wellness Visit:  Age appropriate Health Maintenance and Prevention measures were discussed with patient. Included topics are cancer screening recommendations, ways to keep healthy (see AVS) including dietary and exercise recommendations, regular eye and dental care, use of seat belts, and avoidance  of moderate alcohol use and tobacco use.  Pap smear with high-risk HPV testing done today.  Reordered mammogram encouraged patient to get this done.  Eye exam is up-to-date.  Patient to call [facility] office to set up colonoscopy.  BMI: discussed patient's BMI and encouraged positive lifestyle modifications to help get to or maintain a target BMI.  HM needs and immunizations were addressed and  ordered. See below for orders. See HM and immunization section for updates.  First Shingrix given today, will be due for second in the next 2 to 6 months.  Routine labs and screening tests ordered including cmp, cbc and lipids where appropriate.  Discussed recommendations regarding Vit D and calcium supplementation (see AVS)  Chronic disease management visit and/or acute problem visit:  Diabetes is well controlled.  Diabetic care check list is up-to-date.  Continue current medications.  Recheck 6 months.  Hypertension is well controlled.  Refill metoprolol.  Continue ARB.  Hyperlipidemia: Recheck fasting today.  Continue statin.  Check LFTs.  Hypothyroidism: Recheck today.  Continue medications.  Clinically euthyroid  Follow up: Return in about 3 months (around 02/13/2019) for follow up of diabetes and hypertension.  Orders Placed This Encounter  Procedures  . CBC with Differential/Platelet  . Comprehensive metabolic panel  . Lipid panel  . TSH  . Microalbumin / creatinine urine ratio  . HIV Antibody (routine testing w rflx)   No orders of the defined types were placed in this encounter.     Lifestyle: Body mass index is 34.84 kg/m. Wt Readings from Last 3 Encounters:  11/14/18 178 lb 6.4 oz (80.9 kg)  08/01/18 180 lb 9.6 oz (81.9 kg)  01/29/18 178 lb 6.4 oz (80.9 kg)    Patient Active Problem List   Diagnosis Date Noted  . Mixed hyperlipidemia 01/29/2018    Priority: High  . Primary osteoarthritis of right knee 03/25/2016  . Osteoarthritis, hand 10/18/2011    Overview:  righ 1st MCP joint   . Diabetes mellitus type 2, controlled, without complications (HCC) 01/17/2011  . GERD (gastroesophageal reflux disease) 01/17/2011  . Essential hypertension 01/17/2011  . Acquired hypothyroidism 01/17/2011  . Allergic rhinitis 01/12/2010    Overview:  10/1 IMO update   . Tricuspid regurgitation 01/12/2010    Overview:  Mild Tricuspid Regurgitation [facility]; no  further eval needed    Health Maintenance  Topic Date Due  . MAMMOGRAM  04/28/2017  . COLONOSCOPY  09/25/2018  . HIV Screening  01/30/2019 (Originally 02/22/1971)  . FOOT EXAM  01/30/2019  . HEMOGLOBIN A1C  01/30/2019  . OPHTHALMOLOGY EXAM  04/25/2019  . PAP SMEAR-Modifier  09/20/2019  . TETANUS/TDAP  01/18/2022  . INFLUENZA VACCINE  Completed  . PNEUMOCOCCAL POLYSACCHARIDE VACCINE AGE 19-64 HIGH RISK  Completed  . Hepatitis C Screening  Completed   Immunization History  Administered Date(s) Administered  . Hepatitis B, ped/adol 02/28/1991  . Influenza Split 08/07/2000, 08/25/2011, 08/23/2015  . Influenza, Seasonal, Injecte, Preservative Fre 08/26/2014, 08/22/2015, 09/03/2016  . Influenza,inj,Quad PF,6+ Mos 08/25/2017, 08/01/2018  . PPD Test 11/08/2007  . Pneumococcal Conjugate-13 03/04/2014  . Pneumococcal Polysaccharide-23 01/12/2010  . Td 07/08/2005  . Tdap 01/19/2012   We updated and reviewed the patient's past history in detail and it is documented below. Allergies: Patient is allergic to lisinopril. Past Medical History Patient  has a past medical history of Arthritis, Diabetes mellitus (HCC), GERD (gastroesophageal reflux disease), Hyperlipidemia, Hypertension, Thyroid disease, and Tuberculosis. Past Surgical History Patient  has a past surgical  history that includes Cesarean section. Family History: Patient family history includes Arthritis in her maternal grandmother and mother; Diabetes in her father; Healthy in her daughter and daughter; Hypertension in her father; Rectal cancer in her mother; Stroke in her father and maternal grandfather. Social History:  Patient  reports that she has never smoked. She has never used smokeless tobacco. She reports current alcohol use. She reports that she does not use drugs.  Review of Systems: Constitutional: negative for fever or malaise Ophthalmic: negative for photophobia, double vision or loss of vision Cardiovascular:  negative for chest pain, dyspnea on exertion, or new LE swelling Respiratory: negative for SOB or persistent cough Gastrointestinal: negative for abdominal pain, change in bowel habits or melena Genitourinary: negative for dysuria or gross hematuria, no abnormal uterine bleeding or disharge Musculoskeletal: negative for new gait disturbance or muscular weakness Integumentary: negative for new or persistent rashes, no breast lumps Neurological: negative for TIA or stroke symptoms Psychiatric: negative for SI or delusions Allergic/Immunologic: negative for hives  Patient Care Team    Relationship Specialty Notifications Start End  Willow Ora, MD PCP - General Family Medicine  01/29/18   Sallye Lat, MD Consulting Physician Ophthalmology  01/29/18   Marshell Levan    01/29/18    Comment: Dentist   Wt Readings from Last 3 Encounters:  11/14/18 178 lb 6.4 oz (80.9 kg)  08/01/18 180 lb 9.6 oz (81.9 kg)  01/29/18 178 lb 6.4 oz (80.9 kg)   BP Readings from Last 3 Encounters:  11/14/18 136/80  08/01/18 132/90  01/29/18 136/82    Objective  Vitals: BP 136/80   Pulse 88   Temp 98.9 F (37.2 C) (Oral)   Resp 16   Ht 5' (1.524 m)   Wt 178 lb 6.4 oz (80.9 kg)   SpO2 99%   BMI 34.84 kg/m  General:  Well developed, well nourished, no acute distress  Psych:  Alert and orientedx3,normal mood and affect HEENT:  Normocephalic, atraumatic, non-icteric sclera, PERRL, oropharynx is clear without mass or exudate, supple neck without adenopathy, mass or thyromegaly Cardiovascular:  Normal S1, S2, RRR without gallop, rub or murmur, nondisplaced PMI Respiratory:  Good breath sounds bilaterally, CTAB with normal respiratory effort Gastrointestinal: normal bowel sounds, soft, non-tender, no noted masses. No HSM MSK: no deformities, contusions. Joints are without erythema or swelling. Spine and CVA region are nontender Skin:  Warm, no rashes or suspicious lesions noted Neurologic:    Mental  status is normal. CN 2-11 are normal. Gross motor and sensory exams are normal. Normal gait. No tremor Breast Exam: No mass, skin retraction or nipple discharge is appreciated in either breast. No axillary adenopathy. Fibrocystic changes are not noted Pelvic Exam: Normal external genitalia, no vulvar or vaginal lesions present.  Atrophic, cervical stenosis present, clear cervix w/o CMT. Bimanual exam reveals a nontender fundus w/o masses, nl size. No adnexal masses present. No inguinal adenopathy. A PAP smear was performed.  Diabetic Foot Exam: Appearance - no lesions, ulcers or calluses Skin - no sigificant pallor or erythema Monofilament testing - sensitive bilaterally in following locations:  Right - Great toe, medial, central, lateral ball and posterior foot intact  Left - Great toe, medial, central, lateral ball and posterior foot intact Pulses - +2 distally bilaterally    Commons side effects, risks, benefits, and alternatives for medications and treatment plan prescribed today were discussed, and the patient expressed understanding of the given instructions. Patient is instructed to call or message via MyChart  if he/she has any questions or concerns regarding our treatment plan. No barriers to understanding were identified. We discussed Red Flag symptoms and signs in detail. Patient expressed understanding regarding what to do in case of urgent or emergency type symptoms.   Medication list was reconciled, printed and provided to the patient in AVS. Patient instructions and summary information was reviewed with the patient as documented in the AVS. This note was prepared with assistance of Dragon voice recognition software. Occasional wrong-word or sound-a-like substitutions may have occurred due to the inherent limitations of voice recognition software

## 2018-11-14 NOTE — Addendum Note (Signed)
Addended by: Erenest Blank on: 11/14/2018 09:20 AM   Modules accepted: Orders

## 2018-11-15 LAB — HIV ANTIBODY (ROUTINE TESTING W REFLEX): HIV 1&2 Ab, 4th Generation: NONREACTIVE

## 2018-11-22 LAB — CYTOLOGY - PAP
Diagnosis: NEGATIVE
HPV 16/18/45 genotyping: NEGATIVE
HPV: DETECTED — AB

## 2018-11-22 NOTE — Progress Notes (Signed)
Please call patient: I have reviewed his/her lab results. Pap is normal. No HR HPV. So no need to repeat for 5 years. Thanks.

## 2018-11-23 ENCOUNTER — Encounter: Payer: Self-pay | Admitting: *Deleted

## 2019-02-02 ENCOUNTER — Other Ambulatory Visit: Payer: Self-pay | Admitting: Family Medicine

## 2019-02-26 ENCOUNTER — Other Ambulatory Visit: Payer: Self-pay | Admitting: Family Medicine

## 2019-03-05 ENCOUNTER — Other Ambulatory Visit: Payer: Self-pay | Admitting: Family Medicine

## 2019-03-17 ENCOUNTER — Other Ambulatory Visit: Payer: Self-pay | Admitting: Family Medicine

## 2019-05-07 ENCOUNTER — Ambulatory Visit (INDEPENDENT_AMBULATORY_CARE_PROVIDER_SITE_OTHER): Payer: 59

## 2019-05-07 ENCOUNTER — Other Ambulatory Visit: Payer: Self-pay

## 2019-05-07 ENCOUNTER — Ambulatory Visit: Payer: 59 | Admitting: Family Medicine

## 2019-05-07 ENCOUNTER — Encounter: Payer: Self-pay | Admitting: Family Medicine

## 2019-05-07 ENCOUNTER — Other Ambulatory Visit: Payer: Self-pay | Admitting: Family Medicine

## 2019-05-07 VITALS — BP 136/78 | HR 68 | Temp 98.3°F | Resp 16 | Ht 60.0 in | Wt 182.2 lb

## 2019-05-07 DIAGNOSIS — Z23 Encounter for immunization: Secondary | ICD-10-CM

## 2019-05-07 DIAGNOSIS — M7581 Other shoulder lesions, right shoulder: Secondary | ICD-10-CM | POA: Diagnosis not present

## 2019-05-07 DIAGNOSIS — I10 Essential (primary) hypertension: Secondary | ICD-10-CM

## 2019-05-07 DIAGNOSIS — E782 Mixed hyperlipidemia: Secondary | ICD-10-CM

## 2019-05-07 DIAGNOSIS — E119 Type 2 diabetes mellitus without complications: Secondary | ICD-10-CM | POA: Diagnosis not present

## 2019-05-07 MED ORDER — MELOXICAM 7.5 MG PO TABS: 7.5000 mg | ORAL_TABLET | Freq: Every day | ORAL | 0 refills | Status: DC

## 2019-05-07 MED ORDER — ROSUVASTATIN CALCIUM 10 MG PO TABS: 10.0000 mg | ORAL_TABLET | Freq: Every day | ORAL | 3 refills | Status: DC

## 2019-05-07 NOTE — Patient Instructions (Addendum)
Please return in 6 months for your annual complete physical; please come fasting.  Today you were given your 2nd of 2 Shingrix vaccinations. (series is now complete).  Please set up an appointment for a diabetic eye exam and have the results sent to me.  Please schedule your mammogram.  Your colonoscopy with [facility], Dr. Cristina Gong, is due in November. Please call his office to get scheduled.   If you have any questions or concerns, please don't hesitate to send me a message via MyChart or call the office at [phone]. Thank you for visiting with Meagan Harrison today! It's our pleasure caring for you.  Please check your home blood pressure readings.    Shoulder Range of Motion Exercises Shoulder range of motion (ROM) exercises are done to keep the shoulder moving freely or to increase movement. They are often recommended for people who have shoulder pain or stiffness or who are recovering from a shoulder surgery. Phase 1 exercises When you are able, do this exercise 1-2 times per day for 30-60 seconds in each direction, or as directed by your health care provider. Pendulum exercise To do this exercise while sitting: 1. Sit in a chair or at the edge of your bed with your feet flat on the floor. 2. Let your affected arm hang down in front of you over the edge of the bed or chair. 3. Relax your shoulder, arm, and hand. [place] your body so your arm gently swings in small circles. You can also use your unaffected arm to start the motion. 5. Repeat changing the direction of the circles, swinging your arm left and right, and swinging your arm forward and back. To do this exercise while standing: 1. Stand next to a sturdy chair or table, and hold on to it with your hand on your unaffected side. 2. Bend forward at the waist. 3. Bend your knees slightly. 4. Relax your shoulder, arm, and hand. 5. While keeping your shoulder relaxed, use body motion to swing your arm in small circles. 6. Repeat changing the  direction of the circles, swinging your arm left and right, and swinging your arm forward and back. 7. Between exercises, stand up tall and take a short break to relax your lower back.  Phase 2 exercises Do these exercises 1-2 times per day or as told by your health care provider. Hold each stretch for 30 seconds, and repeat 3 times. Do the exercises with one or both arms as instructed by your health care provider. For these exercises, sit at a table with your hand and arm supported by the table. A chair that slides easily or has wheels can be helpful. External rotation 1. Turn your chair so that your affected side is nearest to the table. 2. Place your forearm on the table to your side. Bend your elbow about 90 at the elbow (right angle) and place your hand palm facing down on the table. Your elbow should be about 6 inches away from your side. 3. Keeping your arm on the table, lean your body forward. Abduction 1. Turn your chair so that your affected side is nearest to the table. 2. Place your forearm and hand on the table so that your thumb points toward the ceiling and your arm is straight out to your side. 3. Slide your hand out to the side and away from you, using your unaffected arm to do the work. 4. To increase the stretch, you can slide your chair away from the table.  Flexion: forward stretch 1. Sit facing the table. Place your hand and elbow on the table in front of you. 2. Slide your hand forward and away from you, using your unaffected arm to do the work. 3. To increase the stretch, you can slide your chair backward. Phase 3 exercises Do these exercises 1-2 times per day or as told by your health care provider. Hold each stretch for 30 seconds, and repeat 3 times. Do the exercises with one or both arms as instructed by your health care provider. Cross-body stretch: posterior capsule stretch 1. Lift your arm straight out in front of you. 2. Bend your arm 90 at the elbow (right  angle) so your forearm moves across your body. 3. Use your other arm to gently pull the elbow across your body, toward your other shoulder. Wall climbs 1. Stand with your affected arm extended out to the side with your hand resting on a door frame. 2. Slide your hand slowly up the door frame. 3. To increase the stretch, step through the door frame. Keep your body upright and do not lean. Wand exercises You will need a cane, a piece of PVC pipe, or a sturdy wooden dowel for wand exercises. Flexion To do this exercise while standing: 1. Hold the wand with both of your hands, palms down. 2. Using the other arm to help, lift your arms up and over your head, if able. 3. Push upward with your other arm to gently increase the stretch. To do this exercise while lying down: 1. Lie on your back with your elbows resting on the floor and the wand in both your hands. Your hands will be palm down, or pointing toward your feet. 2. Lift your hands toward the ceiling, using your unaffected arm to help if needed. 3. Bring your arms overhead as able, using your unaffected arm to help if needed. Internal rotation 1. Stand while holding the wand behind you with both hands. Your unaffected arm should be extended above your head with the arm of the affected side extended behind you at the level of your waist. The wand should be pointing straight up and down as you hold it. 2. Slowly pull the wand up behind your back by straightening the elbow of your unaffected arm and bending the elbow of your affected arm. External rotation 1. Lie on your back with your affected upper arm supported on a small pillow or rolled towel. When you first do this exercise, keep your upper arm close to your body. Over time, bring your arm up to a 90 angle out to the side. 2. Hold the wand across your stomach and with both hands palm up. Your elbow on your affected side should be bent at a 90 angle. 3. Use your unaffected side to help  push your forearm away from you and toward the floor. Keep your elbow on your affected side bent at a 90 angle. Contact a health care provider if you have:  New or increasing pain.  New numbness, tingling, weakness, or discoloration in your arm or hand. This information is not intended to replace advice given to you by your health care provider. Make sure you discuss any questions you have with your health care provider. Document Released: 07/23/2003 Document Revised: 12/06/2017 Document Reviewed: 12/06/2017 Elsevier Patient Education  2020 [employer]

## 2019-05-07 NOTE — Progress Notes (Signed)
Subjective  CC:  Chief Complaint  Patient presents with  . Hypertension  . Diabetes  . Shingles vaccine  . Shoulder Pain    Right side, certain movements causes the pain and ocassionally goes down arm.. Poping and cracking with movement.. Has tried Advil and cream with minimal relief.. Denies any injury    HPI: Meagan Harrison is a 63 y.o. female who presents to the office today for follow up of diabetes, hypertension and problems listed above in the chief complaint.   Diabetic f/u: Her diabetic control is reported as Unchanged.  He has been very well controlled.  She tolerates her medications well.  Of note, she continues on low-dose glipizide. She denies exertional CP or SOB or symptomatic hypoglycemia. She denies foot sores.    Hypertension f/u: Control is fair . Pt reports she is doing well. taking medications as instructed, no medication side effects noted, no TIAs, no chest pain on exertion, no dyspnea on exertion, no swelling of ankles.  Is mildly elevated this morning because patient says she is been running around and is stressed.  She has not checked blood pressures at home. She denies adverse effects from his BP medications. Compliance with medication is good.    Hyperlipidemia f/u: Patient presents for follow up of lipids. Lipids have been well controlled on meds w/o myalgias or side effects. Compliance with treatment thus far has been excellent.She is due a refill.   Complains of right shoulder pain.  Started about 3 months ago after trip traveling with her daughter.  She denies injury but she was carrying heavy baggage and luggage etc.  She complains of pain with certain movements and a popping noise.  She has no pain with her arm at her side or feels all the way above her head but she has pain when lifting above her head.  No weakness.  No prior shoulder problems.  No hand or elbow pain.  She did take Advil on a few occasions with improvement in pain.  She denies neck pain.   Health maintenance: She is overdue for her mammogram.  Colonoscopy was due in November with Dr. Cristina Gong.  Second Shingrix due today.  Assessment  1. Controlled type 2 diabetes mellitus without complication, without long-term current use of insulin ([place])   2. Essential hypertension   3. Need for shingles vaccine   4. Right rotator cuff tendinitis   5. Mixed hyperlipidemia      Plan   Diabetes is currently very well controlled.  Continue current medications but in the near future may consider changing to SGLT2 inhibitor for primary prevention of heart failure.  Hypertension is currently well controlled.  Continue current medications.  She is on an ACE inhibitor.  Hyperlipidemia f/u: Refill statin.  LDL at goal  Probable rotator cuff tendinopathy: Lasix, Mobic x2 weeks.  Range of motion exercises.  X-ray today.  If not improving return for reevaluation  Updated Shingrix vaccination today.  Minder: Patient to schedule colonoscopy and mammogram. Diabetic education: ongoing education regarding chronic disease management for diabetes was given today. We continue to reinforce the ABC's of diabetic management: A1c (<7 or 8 dependent upon patient), tight blood pressure control, and cholesterol management with goal LDL < 100 minimally. We discuss diet strategies, exercise recommendations, medication options and possible side effects. At each visit, we review recommended immunizations and preventive care recommendations for diabetics and stress that good diabetic control can prevent other problems. See below for this patient's data. Hypertension education: ongoing  education regarding management of these chronic disease states was given. Management strategies discussed on successive visits include dietary and exercise recommendations, goals of achieving and maintaining IBW, and lifestyle modifications aiming for adequate sleep and minimizing stressors.   Follow up: Return in about 6 months (around  11/06/2019) for complete physical, follow up of diabetes and hypertension..  Orders Placed This Encounter  Procedures  . DG Shoulder Right  . Varicella-zoster vaccine IM   Meds ordered this encounter  Medications  . meloxicam (MOBIC) 7.5 MG tablet    Sig: Take 1 tablet (7.5 mg total) by mouth daily.    Dispense:  30 tablet    Refill:  0  . rosuvastatin (CRESTOR) 10 MG tablet    Sig: Take 1 tablet (10 mg total) by mouth at bedtime.    Dispense:  90 tablet    Refill:  3      I reviewed the patients updated PMH, FH, and SocHx.  Patient Active Problem List   Diagnosis Date Noted  . Mixed hyperlipidemia 01/29/2018    Priority: High  . Diabetes mellitus type 2, controlled, without complications (HCC) 01/17/2011    Priority: High  . Essential hypertension 01/17/2011    Priority: High  . Acquired hypothyroidism 01/17/2011    Priority: High  . Primary osteoarthritis of right knee 03/25/2016    Priority: Medium  . Osteoarthritis, hand 10/18/2011    Priority: Medium  . GERD (gastroesophageal reflux disease) 01/17/2011    Priority: Medium  . Allergic rhinitis 01/12/2010    Priority: Low  . Tricuspid regurgitation 01/12/2010    Priority: Low   Immunization History  Administered Date(s) Administered  . Hepatitis B, ped/adol 02/28/1991  . Influenza Split 08/07/2000, 08/25/2011, 08/23/2015  . Influenza, Seasonal, Injecte, Preservative Fre 08/26/2014, 08/22/2015, 09/03/2016  . Influenza,inj,Quad PF,6+ Mos 08/25/2017, 08/01/2018  . PPD Test 11/08/2007  . Pneumococcal Conjugate-13 03/04/2014  . Pneumococcal Polysaccharide-23 01/12/2010  . Td 07/08/2005  . Tdap 01/19/2012  . Zoster Recombinat (Shingrix) 11/14/2018   Health Maintenance  Topic Date Due  . MAMMOGRAM  04/28/2017  . COLONOSCOPY  09/25/2018  . OPHTHALMOLOGY EXAM  04/25/2019  . HEMOGLOBIN A1C  05/15/2019  . INFLUENZA VACCINE  06/08/2019  . FOOT EXAM  11/15/2019  . TETANUS/TDAP  01/18/2022  . PAP SMEAR-Modifier   11/15/2023  . PNEUMOCOCCAL POLYSACCHARIDE VACCINE AGE 71-64 HIGH RISK  Completed  . Hepatitis C Screening  Completed  . HIV Screening  Completed   Diabetes and HTN Related Lab Review: Lab Results  Component Value Date   HGBA1C 6.2 (A) 11/14/2018   HGBA1C 6.9 (H) 08/01/2018   HGBA1C 6.2 01/29/2018    Lab Results  Component Value Date   MICROALBUR <0.7 11/14/2018   Lab Results  Component Value Date   CREATININE 0.88 11/14/2018   BUN 17 11/14/2018   NA 140 11/14/2018   K 4.1 11/14/2018   CL 102 11/14/2018   CO2 27 11/14/2018   Lab Results  Component Value Date   CHOL 116 11/14/2018   CHOL 116 08/01/2018   CHOL 122 10/20/2017   Lab Results  Component Value Date   HDL 47.00 11/14/2018   HDL 47.80 08/01/2018   HDL 51 10/20/2017   Lab Results  Component Value Date   LDLCALC 47 11/14/2018   LDLCALC 38 08/01/2018   LDLCALC 49 10/20/2017   Lab Results  Component Value Date   TRIG 113.0 11/14/2018   TRIG 148.0 08/01/2018   TRIG 110 10/20/2017   Lab Results  Component Value Date   CHOLHDL 2 11/14/2018   CHOLHDL 2 08/01/2018   No results found for: LDLDIRECT The ASCVD Risk score Denman George(Goff DC Jr., et al., 2013) failed to calculate for the following reasons:   The valid total cholesterol range is 130 to 320 mg/dL  BP Readings from Last 3 Encounters:  05/07/19 (!) 142/84  11/14/18 136/80  08/01/18 132/90   Wt Readings from Last 3 Encounters:  05/07/19 182 lb 3.2 oz (82.6 kg)  11/14/18 178 lb 6.4 oz (80.9 kg)  08/01/18 180 lb 9.6 oz (81.9 kg)    Allergies: Patient is allergic to lisinopril. Family History: Patient family history includes Arthritis in her maternal grandmother and mother; Diabetes in her father; Healthy in her daughter and daughter; Hypertension in her father; Rectal cancer in her mother; Stroke in her father and maternal grandfather. Social History:  Patient  reports that she has never smoked. She has never used smokeless tobacco. She reports  current alcohol use. She reports that she does not use drugs.  Review of Systems: Ophthalmic: negative for eye pain, loss of vision or double vision Cardiovascular: negative for chest pain Respiratory: negative for SOB or persistent cough Gastrointestinal: negative for abdominal pain Genitourinary: negative for dysuria or gross hematuria MSK: negative for foot lesions Neurologic: negative for weakness or gait disturbance Current Meds  Medication Sig  . Calcium Citrate-Vitamin D 200-250 MG-UNIT TABS Take by mouth.  . cetirizine (ZYRTEC) 10 MG tablet Take 10 mg by mouth daily as needed for allergies.  . cyclobenzaprine (FLEXERIL) 5 MG tablet TAKE 2 TABLETS BY MOUTH THREE TIMES DAILY AS NEEDED FOR MUSCLE SPASMS  . diclofenac sodium (VOLTAREN) 1 % GEL APPLY 1 GRAMS TO affected joints  FOUR TIMES DAILY AS DIRECTED  . famotidine (PEPCID) 20 MG tablet Take 20 mg by mouth 2 (two) times daily.  . fluticasone (FLONASE) 50 MCG/ACT nasal spray INSTILL 1 TO 2 SPRAYS IN EACH NOSTRIL ONCE TO TWICE DAILY  . glipiZIDE (GLUCOTROL XL) 2.5 MG 24 hr tablet TAKE 1 TABLET BY MOUTH DAILY  . glucosamine-chondroitin 500-400 MG tablet Take 2 tablets by mouth daily. 1500-200, Called Move ahead  . ibuprofen (ADVIL,MOTRIN) 200 MG tablet Take by mouth.  . levothyroxine (SYNTHROID) 50 MCG tablet TAKE 1 TABLET BY MOUTH DAILY  . losartan (COZAAR) 100 MG tablet TAKE 1 TABLET BY MOUTH DAILY  . metFORMIN (GLUCOPHAGE) 1000 MG tablet TAKE 1 TABLET BY MOUTH TWICE DAILY WITH MEALS  . metoprolol tartrate (LOPRESSOR) 50 MG tablet Take 1 tablet (50 mg total) by mouth 2 (two) times daily.  . montelukast (SINGULAIR) 10 MG tablet TAKE 1 TABLET BY MOUTH AT BEDTIME  . Multiple Vitamin (MULTIVITAMIN) tablet Take by mouth.  . rosuvastatin (CRESTOR) 10 MG tablet Take 1 tablet (10 mg total) by mouth at bedtime.  . [DISCONTINUED] rosuvastatin (CRESTOR) 10 MG tablet Take 1 tablet (10 mg total) by mouth at bedtime.    Objective  Vitals: BP  (!) 142/84   Pulse 69   Temp 98.3 F (36.8 C) (Oral)   Resp 16   Ht 5' (1.524 m)   Wt 182 lb 3.2 oz (82.6 kg)   SpO2 97%   BMI 35.58 kg/m  General: well appearing, no acute distress  Psych:  Alert and oriented, normal mood and affect HEENT:  Normocephalic, atraumatic, moist mucous membranes, supple neck  Cardiovascular:  Nl S1 and S2, RRR without murmur, gallop or rub. no edema Respiratory:  Good breath sounds bilaterally, CTAB with normal effort,  no rales Gastrointestinal: normal BS, soft, nontender Skin:  Warm, no rashes Neurologic:   Mental status is normal. normal gait Musculoskeletal: Right shoulder with subacromial tenderness, full passive range of motion, active range of motion full but pain at 90 degrees abduction.  Positive empty can testing.   Commons side effects, risks, benefits, and alternatives for medications and treatment plan prescribed today were discussed, and the patient expressed understanding of the given instructions. Patient is instructed to call or message via MyChart if he/she has any questions or concerns regarding our treatment plan. No barriers to understanding were identified. We discussed Red Flag symptoms and signs in detail. Patient expressed understanding regarding what to do in case of urgent or emergency type symptoms.   Medication list was reconciled, printed and provided to the patient in AVS. Patient instructions and summary information was reviewed with the patient as documented in the AVS. This note was prepared with assistance of Dragon voice recognition software. Occasional wrong-word or sound-a-like substitutions may have occurred due to the inherent limitations of voice recognition software

## 2019-05-29 ENCOUNTER — Other Ambulatory Visit: Payer: Self-pay | Admitting: Family Medicine

## 2019-08-01 ENCOUNTER — Other Ambulatory Visit: Payer: Self-pay | Admitting: Family Medicine

## 2019-11-06 ENCOUNTER — Ambulatory Visit: Payer: 59 | Admitting: Family Medicine

## 2019-11-13 ENCOUNTER — Other Ambulatory Visit: Payer: Self-pay | Admitting: Family Medicine

## 2019-11-14 ENCOUNTER — Ambulatory Visit: Payer: 59 | Admitting: Family Medicine

## 2019-11-22 ENCOUNTER — Encounter: Payer: Self-pay | Admitting: Family Medicine

## 2019-11-22 ENCOUNTER — Ambulatory Visit (INDEPENDENT_AMBULATORY_CARE_PROVIDER_SITE_OTHER): Payer: 59 | Admitting: Family Medicine

## 2019-11-22 VITALS — BP 124/86 | HR 75 | Temp 97.7°F | Ht 60.0 in | Wt 184.8 lb

## 2019-11-22 DIAGNOSIS — E119 Type 2 diabetes mellitus without complications: Secondary | ICD-10-CM

## 2019-11-22 DIAGNOSIS — Z0001 Encounter for general adult medical examination with abnormal findings: Secondary | ICD-10-CM

## 2019-11-22 DIAGNOSIS — Z0000 Encounter for general adult medical examination without abnormal findings: Secondary | ICD-10-CM

## 2019-11-22 DIAGNOSIS — E039 Hypothyroidism, unspecified: Secondary | ICD-10-CM

## 2019-11-22 DIAGNOSIS — Z1231 Encounter for screening mammogram for malignant neoplasm of breast: Secondary | ICD-10-CM

## 2019-11-22 DIAGNOSIS — E782 Mixed hyperlipidemia: Secondary | ICD-10-CM | POA: Diagnosis not present

## 2019-11-22 DIAGNOSIS — I10 Essential (primary) hypertension: Secondary | ICD-10-CM | POA: Diagnosis not present

## 2019-11-22 DIAGNOSIS — M7581 Other shoulder lesions, right shoulder: Secondary | ICD-10-CM | POA: Diagnosis not present

## 2019-11-22 LAB — CBC WITH DIFFERENTIAL/PLATELET
Basophils Absolute: 0 10*3/uL (ref 0.0–0.1)
Basophils Relative: 0.7 % (ref 0.0–3.0)
Eosinophils Absolute: 0.1 10*3/uL (ref 0.0–0.7)
Eosinophils Relative: 1.7 % (ref 0.0–5.0)
HCT: 37.5 % (ref 36.0–46.0)
Hemoglobin: 12.6 g/dL (ref 12.0–15.0)
Lymphocytes Relative: 24.7 % (ref 12.0–46.0)
Lymphs Abs: 1.8 10*3/uL (ref 0.7–4.0)
MCHC: 33.7 g/dL (ref 30.0–36.0)
MCV: 99.9 fl (ref 78.0–100.0)
Monocytes Absolute: 0.5 10*3/uL (ref 0.1–1.0)
Monocytes Relative: 7 % (ref 3.0–12.0)
Neutro Abs: 4.7 10*3/uL (ref 1.4–7.7)
Neutrophils Relative %: 65.9 % (ref 43.0–77.0)
Platelets: 242 10*3/uL (ref 150.0–400.0)
RBC: 3.75 Mil/uL — ABNORMAL LOW (ref 3.87–5.11)
RDW: 13.6 % (ref 11.5–15.5)
WBC: 7.1 10*3/uL (ref 4.0–10.5)

## 2019-11-22 LAB — LIPID PANEL
Cholesterol: 128 mg/dL (ref 0–200)
HDL: 56.5 mg/dL (ref >39.00)
LDL Cholesterol: 49 mg/dL (ref 0–99)
NonHDL: 71.88
Total CHOL/HDL Ratio: 2
Triglycerides: 113 mg/dL (ref 0.0–149.0)
VLDL: 22.6 mg/dL (ref 0.0–40.0)

## 2019-11-22 LAB — MICROALBUMIN / CREATININE URINE RATIO
Creatinine,U: 46 mg/dL
Microalb Creat Ratio: 1.5 mg/g (ref 0.0–30.0)
Microalb, Ur: <0.7 mg/dL (ref 0.0–1.9)

## 2019-11-22 LAB — COMPREHENSIVE METABOLIC PANEL
ALT: 17 U/L (ref 0–35)
AST: 16 U/L (ref 0–37)
Albumin: 4.4 g/dL (ref 3.5–5.2)
Alkaline Phosphatase: 69 U/L (ref 39–117)
BUN: 13 mg/dL (ref 6–23)
CO2: 28 mEq/L (ref 19–32)
Calcium: 9.6 mg/dL (ref 8.4–10.5)
Chloride: 101 mEq/L (ref 96–112)
Creatinine, Ser: 0.91 mg/dL (ref 0.40–1.20)
GFR: 62.49 mL/min (ref >60.00)
Glucose, Bld: 128 mg/dL — ABNORMAL HIGH (ref 70–99)
Potassium: 3.9 mEq/L (ref 3.5–5.1)
Sodium: 141 mEq/L (ref 135–145)
Total Bilirubin: 0.5 mg/dL (ref 0.2–1.2)
Total Protein: 7.1 g/dL (ref 6.0–8.3)

## 2019-11-22 LAB — POCT GLYCOSYLATED HEMOGLOBIN (HGB A1C): Hemoglobin A1C: 6.6 % — AB (ref 4.0–5.6)

## 2019-11-22 LAB — TSH: TSH: 3.32 u[IU]/mL (ref 0.35–4.50)

## 2019-11-22 MED ORDER — TRIAMCINOLONE ACETONIDE 40 MG/ML IJ SUSP
40.0000 mg | Freq: Once | INTRAMUSCULAR | Status: AC
  Administered 2019-11-22: 40 mg via INTRAMUSCULAR

## 2019-11-22 NOTE — Progress Notes (Signed)
Subjective  Chief Complaint  Patient presents with  . Annual Exam  . Diabetes  . Hyperlipidemia  . Hypertension  . Hypothyroidism    HPI: Meagan Harrison is a 64 y.o. female who presents to [facility] [facility] today for a Female Wellness Visit. She also has the concerns and/or needs as listed above in the chief complaint. These will be addressed in addition to the Health Maintenance Visit.   Wellness Visit: annual visit with health maintenance review and exam without Pap   HM: overdue mammogram and colonoscopy.  imms up to date. Pap up to date. Defers some screens due to covid surge.  Chronic disease f/u and/or acute problem visit: (deemed necessary to be done in addition to the wellness visit):  Diabetes follow up: Her diabetic control is reported as Unchanged. Feeling well on diabetic meds w/o sxs of hyperglycemia. Diet was off over the holidays but she is back to her normal now.  She denies exertional CP or SOB or symptomatic hypoglycemia. She denies foot sores or paresthesias.  HLD and HTN: has been well controlled. Due for recheck on meds. Feeling well. Taking medications w/o adverse effects. No symptoms of CHF, angina; no palpitations, sob, cp or lower extremity edema. Compliant with meds.   Right RCT: seen months ago and treated conservatively with nsaids and rom exercise. Did better for awhile but now hurting again. Has been working from home and uses right hand for mouse on computer. Desk may be a bit high for her so right arm is raised; this seems to aggravate her shoulder. Can lift up to about 90 degrees then hurts and struggles to get a little higher: pops, then can lift above head easily. Sounds like impingement. No radicular sxs or distal weakness. Xray has been done and looked fine.   Low thyroid: due for recheck. Clinically euthyroid. No recent dose changes   Immunization History  Administered Date(s) Administered  . Hepatitis B, ped/adol 02/28/1991  .  Influenza Split 08/07/2000, 08/25/2011, 08/23/2015  . Influenza, Seasonal, Injecte, Preservative Fre 08/26/2014, 08/22/2015, 09/03/2016  . Influenza,inj,Quad PF,6+ Mos 08/25/2017, 08/01/2018, 08/08/2019  . PPD Test 11/08/2007  . Pneumococcal Conjugate-13 03/04/2014  . Pneumococcal Polysaccharide-23 01/12/2010  . Td 07/08/2005  . Tdap 01/19/2012  . Zoster Recombinat (Shingrix) 11/14/2018, 05/07/2019    Diabetes Related Lab Review: Lab Results  Component Value Date   HGBA1C 6.6 (A) 11/22/2019   HGBA1C 6.2 (A) 11/14/2018   HGBA1C 6.9 (H) 08/01/2018    Lab Results  Component Value Date   MICROALBUR <0.7 11/14/2018   Lab Results  Component Value Date   CREATININE 0.88 11/14/2018   BUN 17 11/14/2018   NA 140 11/14/2018   K 4.1 11/14/2018   CL 102 11/14/2018   CO2 27 11/14/2018   Lab Results  Component Value Date   CHOL 116 11/14/2018   CHOL 116 08/01/2018   CHOL 122 10/20/2017   Lab Results  Component Value Date   HDL 47.00 11/14/2018   HDL 47.80 08/01/2018   HDL 51 10/20/2017   Lab Results  Component Value Date   LDLCALC 47 11/14/2018   LDLCALC 38 08/01/2018   LDLCALC 49 10/20/2017   Lab Results  Component Value Date   TRIG 113.0 11/14/2018   TRIG 148.0 08/01/2018   TRIG 110 10/20/2017   Lab Results  Component Value Date   CHOLHDL 2 11/14/2018   CHOLHDL 2 08/01/2018   No results found for: LDLDIRECT The ASCVD Risk score Mikey Bussing  DC Jr., et al., 2013) failed to calculate for the following reasons:   The valid total cholesterol range is 130 to 320 mg/dL  Lab Results  Component Value Date   TSH 2.66 11/14/2018     BP Readings from Last 3 Encounters:  11/22/19 124/86  05/07/19 136/78  11/14/18 136/80   Wt Readings from Last 3 Encounters:  11/22/19 184 lb 12.8 oz (83.8 kg)  05/07/19 182 lb 3.2 oz (82.6 kg)  11/14/18 178 lb 6.4 oz (80.9 kg)    Health Maintenance  Topic Date Due  . MAMMOGRAM  04/28/2017  . COLONOSCOPY  09/25/2018  . FOOT EXAM   11/15/2019  . OPHTHALMOLOGY EXAM  04/07/2020  . HEMOGLOBIN A1C  05/21/2020  . TETANUS/TDAP  01/18/2022  . PAP SMEAR-Modifier  11/15/2023  . INFLUENZA VACCINE  Completed  . PNEUMOCOCCAL POLYSACCHARIDE VACCINE AGE 88-64 HIGH RISK  Completed  . Hepatitis C Screening  Completed  . HIV Screening  Completed     Assessment  1. Annual physical exam   2. Controlled type 2 diabetes mellitus without complication, without long-term current use of insulin ([place])   3. Mixed hyperlipidemia   4. Essential hypertension   5. Acquired hypothyroidism   6. Encounter for screening mammogram for malignant neoplasm of breast   7. Right rotator cuff tendinitis      Plan  Female Wellness Visit:  Age appropriate Health Maintenance and Prevention measures were discussed with patient. Included topics are cancer screening recommendations, ways to keep healthy (see AVS) including dietary and exercise recommendations, regular eye and dental care, use of seat belts, and avoidance of moderate alcohol use and tobacco use. Defers mammogram for a few months and crc colonoscopy until summer. Orders placed.   BMI: discussed patient's BMI and encouraged positive lifestyle modifications to help get to or maintain a target BMI.  HM needs and immunizations were addressed and ordered. See below for orders. See HM and immunization section for updates.up to date  Routine labs and screening tests ordered including cmp, cbc and lipids where appropriate.  Discussed recommendations regarding Vit D and calcium supplementation (see AVS)  Chronic disease management visit and/or acute problem visit:  DM: control is good. Continue current meds w/o changes. Check labs and urine.   HLD: recheck today. On crestor  HTN: good control Check renal function and electrolytes.  Hypothyroid: recheck today.   RCT/impingement: s/p steroid injection today; Routine post procedure care instructions were given to patient in detail. Gave HO on  shoulder exercise from Pt advisor. Recheck 3 months if not resolving   Follow up: Return in about 3 months (around 02/20/2020) for recheck shoulder, 6 months for DM and HTN fu.  Orders Placed This Encounter  Procedures  . mammo BC  . CBC w/Diff  . CMP  . Lipids  . Urine MAC  . TSH  . A1C POCT   Meds ordered this encounter  Medications  . triamcinolone acetonide (KENALOG-40) injection 40 mg      Lifestyle: Body mass index is 36.09 kg/m. Wt Readings from Last 3 Encounters:  11/22/19 184 lb 12.8 oz (83.8 kg)  05/07/19 182 lb 3.2 oz (82.6 kg)  11/14/18 178 lb 6.4 oz (80.9 kg)     Patient Active Problem List   Diagnosis Date Noted  . Mixed hyperlipidemia 01/29/2018    Priority: High  . Diabetes mellitus type 2, controlled, without complications ([place]) 37/62/8315    Priority: High  . Essential hypertension 01/17/2011    Priority: High  .  Acquired hypothyroidism 01/17/2011    Priority: High  . Primary osteoarthritis of right knee 03/25/2016    Priority: Medium  . Osteoarthritis, hand 10/18/2011    Priority: Medium    Overview:  righ 1st MCP joint   . GERD (gastroesophageal reflux disease) 01/17/2011    Priority: Medium  . Allergic rhinitis 01/12/2010    Priority: Low    Overview:  10/1 IMO update   . Tricuspid regurgitation 01/12/2010    Priority: Low    Overview:  Mild Tricuspid Regurgitation - echo at [facility]; no further eval needed    Health Maintenance  Topic Date Due  . MAMMOGRAM  04/28/2017  . COLONOSCOPY  09/25/2018  . FOOT EXAM  11/15/2019  . OPHTHALMOLOGY EXAM  04/07/2020  . HEMOGLOBIN A1C  05/21/2020  . TETANUS/TDAP  01/18/2022  . PAP SMEAR-Modifier  11/15/2023  . INFLUENZA VACCINE  Completed  . PNEUMOCOCCAL POLYSACCHARIDE VACCINE AGE 14-64 HIGH RISK  Completed  . Hepatitis C Screening  Completed  . HIV Screening  Completed   Immunization History  Administered Date(s) Administered  . Hepatitis B, ped/adol 02/28/1991  . Influenza Split  08/07/2000, 08/25/2011, 08/23/2015  . Influenza, Seasonal, Injecte, Preservative Fre 08/26/2014, 08/22/2015, 09/03/2016  . Influenza,inj,Quad PF,6+ Mos 08/25/2017, 08/01/2018, 08/08/2019  . PPD Test 11/08/2007  . Pneumococcal Conjugate-13 03/04/2014  . Pneumococcal Polysaccharide-23 01/12/2010  . Td 07/08/2005  . Tdap 01/19/2012  . Zoster Recombinat (Shingrix) 11/14/2018, 05/07/2019   We updated and reviewed the patient's past history in detail and it is documented below. Allergies: Patient is allergic to lisinopril. Past Medical History Patient  has a past medical history of Arthritis, Diabetes mellitus ([place]), GERD (gastroesophageal reflux disease), Hyperlipidemia, Hypertension, Thyroid disease, and Tuberculosis. Past Surgical History Patient  has a past surgical history that includes Cesarean section. Family History: Patient family history includes Arthritis in her maternal grandmother and mother; Diabetes in her father; Healthy in her daughter and daughter; Hypertension in her father; Rectal cancer in her mother; Stroke in her father and maternal grandfather. Social History:  Patient  reports that she has never smoked. She has never used smokeless tobacco. She reports current alcohol use. She reports that she does not use drugs.  Review of Systems: Constitutional: negative for fever or malaise Ophthalmic: negative for photophobia, double vision or loss of vision Cardiovascular: negative for chest pain, dyspnea on exertion, or new LE swelling Respiratory: negative for SOB or persistent cough Gastrointestinal: negative for abdominal pain, change in bowel habits or melena Genitourinary: negative for dysuria or gross hematuria, no abnormal uterine bleeding or disharge Musculoskeletal: negative for new gait disturbance or muscular weakness Integumentary: negative for new or persistent rashes, no breast lumps Neurological: negative for TIA or stroke symptoms Psychiatric: negative for SI  or delusions Allergic/Immunologic: negative for hives  Patient Care Team    Relationship Specialty Notifications Start End  Leamon Arnt, MD PCP - General Family Medicine  01/29/18   Warden Fillers, MD Consulting Physician Ophthalmology  01/29/18   Ayesha Mohair    01/29/18    Comment: Dentist    Objective  Vitals: BP 124/86 (BP Location: Right Arm, Patient Position: Sitting, Cuff Size: Large)   Pulse 75   Temp 97.7 F (36.5 C) (Temporal)   Ht 5' (1.524 m)   Wt 184 lb 12.8 oz (83.8 kg)   SpO2 99%   BMI 36.09 kg/m  General:  Well developed, well nourished, no acute distress  Psych:  Alert and orientedx3,normal mood and affect HEENT:  Normocephalic,  atraumatic, non-icteric sclera, PERRL, oropharynx is clear without mass or exudate, supple neck without adenopathy, mass or thyromegaly Cardiovascular:  Normal S1, S2, RRR without gallop, rub or murmur, nondisplaced PMI Respiratory:  Good breath sounds bilaterally, CTAB with normal respiratory effort Gastrointestinal: normal bowel sounds, soft, non-tender, no noted masses. No HSM MSK: no deformities, contusions. Joints are without erythema or swelling. Spine and CVA region are nontender Right shoulder with pain at 90 degrees abduction with popping. + empty can testing. FROM active and passive Skin:  Warm, no rashes or suspicious lesions noted Neurologic:    Mental status is normal. CN 2-11 are normal. Gross motor and sensory exams are normal. Normal gait. No tremor Breast Exam: No mass, skin retraction or nipple discharge is appreciated in either breast. No axillary adenopathy. 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 The shoulder was prepped with alcohol  and the skin was anesthetized with cold spray. Using a 25 gauge needle the subacromial space was injected with 4 mL 1% lidocaine and 1 mL of triamcinolone (KENALOG) 34m/ml under the posterior aspect of the acromion. The injection site was cleansed with topical isopropyl alcohol and a dressing was applied.  Complications:  None; patient tolerated the procedure well.    Commons side effects, risks, benefits, and alternatives for medications and treatment plan prescribed today were discussed, and the patient expressed understanding of the given instructions. Patient is instructed to call or message via MyChart if he/she has any questions or concerns regarding our treatment plan. No barriers to understanding were identified. We discussed Red Flag symptoms and signs in detail. Patient expressed understanding regarding what to do in case of urgent or emergency type symptoms.   Medication list was reconciled, printed and provided to the patient in AVS. Patient instructions and summary information was reviewed with the patient as documented in the AVS. This note was prepared with assistance of Dragon voice recognition software. Occasional wrong-word or sound-a-like substitutions may have occurred due to the inherent limitations of voice recognition software  This visit occurred during the SARS-CoV-2 public health emergency.  Safety protocols were in place, including screening questions prior to the visit, additional usage of staff PPE, and extensive cleaning of exam room while observing  appropriate contact time as indicated for disinfecting solutions.

## 2019-11-22 NOTE — Patient Instructions (Signed)
Please return in 3 months to recheck your shoulder.   Please call to schedule your colonoscopy for this summer.  I have ordered your mammogram to be schedule in the spring. We will call you to get that set up.   I will release your lab results to you on your MyChart account with further instructions. Please reply with any questions.   If you have any questions or concerns, please don't hesitate to send me a message via MyChart or call the office at [phone]. Thank you for visiting with [place] today! It's our pleasure caring for you.  You had a steroid injection today.   Things to be aware of after this injection are listed below:  You may experience no significant improvement or even a slight worsening in your symptoms during the first 24 to 48 hours.  After that we expect your symptoms to improve gradually over the next 2 weeks for the medicine to have its maximal effect.  You should continue to have improvement out to 6 weeks after your injection.  I recommend icing the site of the injection for 20 minutes  1-2 times the day of your injection  You may shower but no swimming, tub bath or Jacuzzi for 24 hours.  If your bandage falls off this does not need to be replaced.  It is appropriate to remove the bandage after 4 hours.  You may resume light activities as tolerated.     POSSIBLE PROCEDURE SIDE EFFECTS: The side effects of the injection are usually fairly minimal however if you may experience some of the following side effects that are usually self-limited and will is off on their own.  If you are concerned please feel free to call the office with questions:             Increased numbness or tingling             Nausea or vomiting             Swelling or bruising at the injection site    Please call our office if if you experience any of the following symptoms over the next week as these can be signs of infection:              Fever greater than 100.83F             Significant  swelling at the injection site             Significant redness or drainage from the injection site     Preventive Care 46-12 Years Old, Female Preventive care refers to visits with your health care provider and lifestyle choices that can promote health and wellness. This includes:  A yearly physical exam. This may also be called an annual well check.  Regular dental visits and eye exams.  Immunizations.  Screening for certain conditions.  Healthy lifestyle choices, such as eating a healthy diet, getting regular exercise, not using drugs or products that contain nicotine and tobacco, and limiting alcohol use. What can I expect for my preventive care visit? Physical exam Your health care provider will check your:  Height and weight. This may be used to calculate body mass index (BMI), which tells if you are at a healthy weight.  Heart rate and blood pressure.  Skin for abnormal spots. Counseling Your health care provider may ask you questions about your:  Alcohol, tobacco, and drug use.  Emotional well-being.  Home and relationship well-being.  Sexual activity.  Eating habits.  Work and work Statistician.  Method of birth control.  Menstrual cycle.  Pregnancy history. What immunizations do I need?  Influenza (flu) vaccine  This is recommended every year. Tetanus, diphtheria, and pertussis (Tdap) vaccine  You may need a Td booster every 10 years. Varicella (chickenpox) vaccine  You may need this if you have not been vaccinated. Zoster (shingles) vaccine  You may need this after age 29. Measles, mumps, and rubella (MMR) vaccine  You may need at least one dose of MMR if you were born in 1957 or later. You may also need a second dose. Pneumococcal conjugate (PCV13) vaccine  You may need this if you have certain conditions and were not previously vaccinated. Pneumococcal polysaccharide (PPSV23) vaccine  You may need one or two doses if you smoke cigarettes  or if you have certain conditions. Meningococcal conjugate (MenACWY) vaccine  You may need this if you have certain conditions. Hepatitis A vaccine  You may need this if you have certain conditions or if you travel or work in places where you may be exposed to hepatitis A. Hepatitis B vaccine  You may need this if you have certain conditions or if you travel or work in places where you may be exposed to hepatitis B. Haemophilus influenzae type b (Hib) vaccine  You may need this if you have certain conditions. Human papillomavirus (HPV) vaccine  If recommended by your health care provider, you may need three doses over 6 months. You may receive vaccines as individual doses or as more than one vaccine together in one shot (combination vaccines). Talk with your health care provider about the risks and benefits of combination vaccines. What tests do I need? Blood tests  Lipid and cholesterol levels. These may be checked every 5 years, or more frequently if you are over 45 years old.  Hepatitis C test.  Hepatitis B test. Screening  Lung cancer screening. You may have this screening every year starting at age 56 if you have a 30-pack-year history of smoking and currently smoke or have quit within the past 15 years.  Colorectal cancer screening. All adults should have this screening starting at age 57 and continuing until age 60. Your health care provider may recommend screening at age 50 if you are at increased risk. You will have tests every 1-10 years, depending on your results and the type of screening test.  Diabetes screening. This is done by checking your blood sugar (glucose) after you have not eaten for a while (fasting). You may have this done every 1-3 years.  Mammogram. This may be done every 1-2 years. Talk with your health care provider about when you should start having regular mammograms. This may depend on whether you have a family history of breast cancer.  BRCA-related  cancer screening. This may be done if you have a family history of breast, ovarian, tubal, or peritoneal cancers.  Pelvic exam and Pap test. This may be done every 3 years starting at age 80. Starting at age 37, this may be done every 5 years if you have a Pap test in combination with an HPV test. Other tests  Sexually transmitted disease (STD) testing.  Bone density scan. This is done to screen for osteoporosis. You may have this scan if you are at high risk for osteoporosis. Follow these instructions at home: Eating and drinking  Eat a diet that includes fresh fruits and vegetables, whole grains, lean protein, and low-fat dairy.  Take vitamin and  mineral supplements as recommended by your health care provider.  Do not drink alcohol if: ? Your health care provider tells you not to drink. ? You are pregnant, may be pregnant, or are planning to become pregnant.  If you drink alcohol: ? Limit how much you have to 0-1 drink a day. ? Be aware of how much alcohol is in your drink. In the [place] one drink equals one 12 oz bottle of beer (355 mL), one 5 oz glass of wine (148 mL), or one 1 oz glass of hard liquor (44 mL). Lifestyle  Take daily care of your teeth and gums.  Stay active. Exercise for at least 30 minutes on 5 or more days each week.  Do not use any products that contain nicotine or tobacco, such as cigarettes, e-cigarettes, and chewing tobacco. If you need help quitting, ask your health care provider.  If you are sexually active, practice safe sex. Use a condom or other form of birth control (contraception) in order to prevent pregnancy and STIs (sexually transmitted infections).  If told by your health care provider, take low-dose aspirin daily starting at age 18. What's next?  Visit your health care provider once a year for a well check visit.  Ask your health care provider how often you should have your eyes and teeth checked.  Stay up to date on all vaccines. This  information is not intended to replace advice given to you by your health care provider. Make sure you discuss any questions you have with your health care provider. Document Revised: 07/05/2018 Document Reviewed: 07/05/2018 Elsevier Patient Education  2020 [employer].

## 2020-02-12 ENCOUNTER — Other Ambulatory Visit: Payer: Self-pay | Admitting: Family Medicine

## 2020-02-21 ENCOUNTER — Ambulatory Visit: Payer: 59 | Admitting: Family Medicine

## 2020-02-28 IMAGING — DX RIGHT SHOULDER - 2+ VIEW
3 series · 3 of 3 positions shown · non-contrast
Comparison: None.

CLINICAL DATA: Right rotator cuff tendonitis.

EXAM:
RIGHT SHOULDER - 2+ VIEW

[shoulder grashey ap]
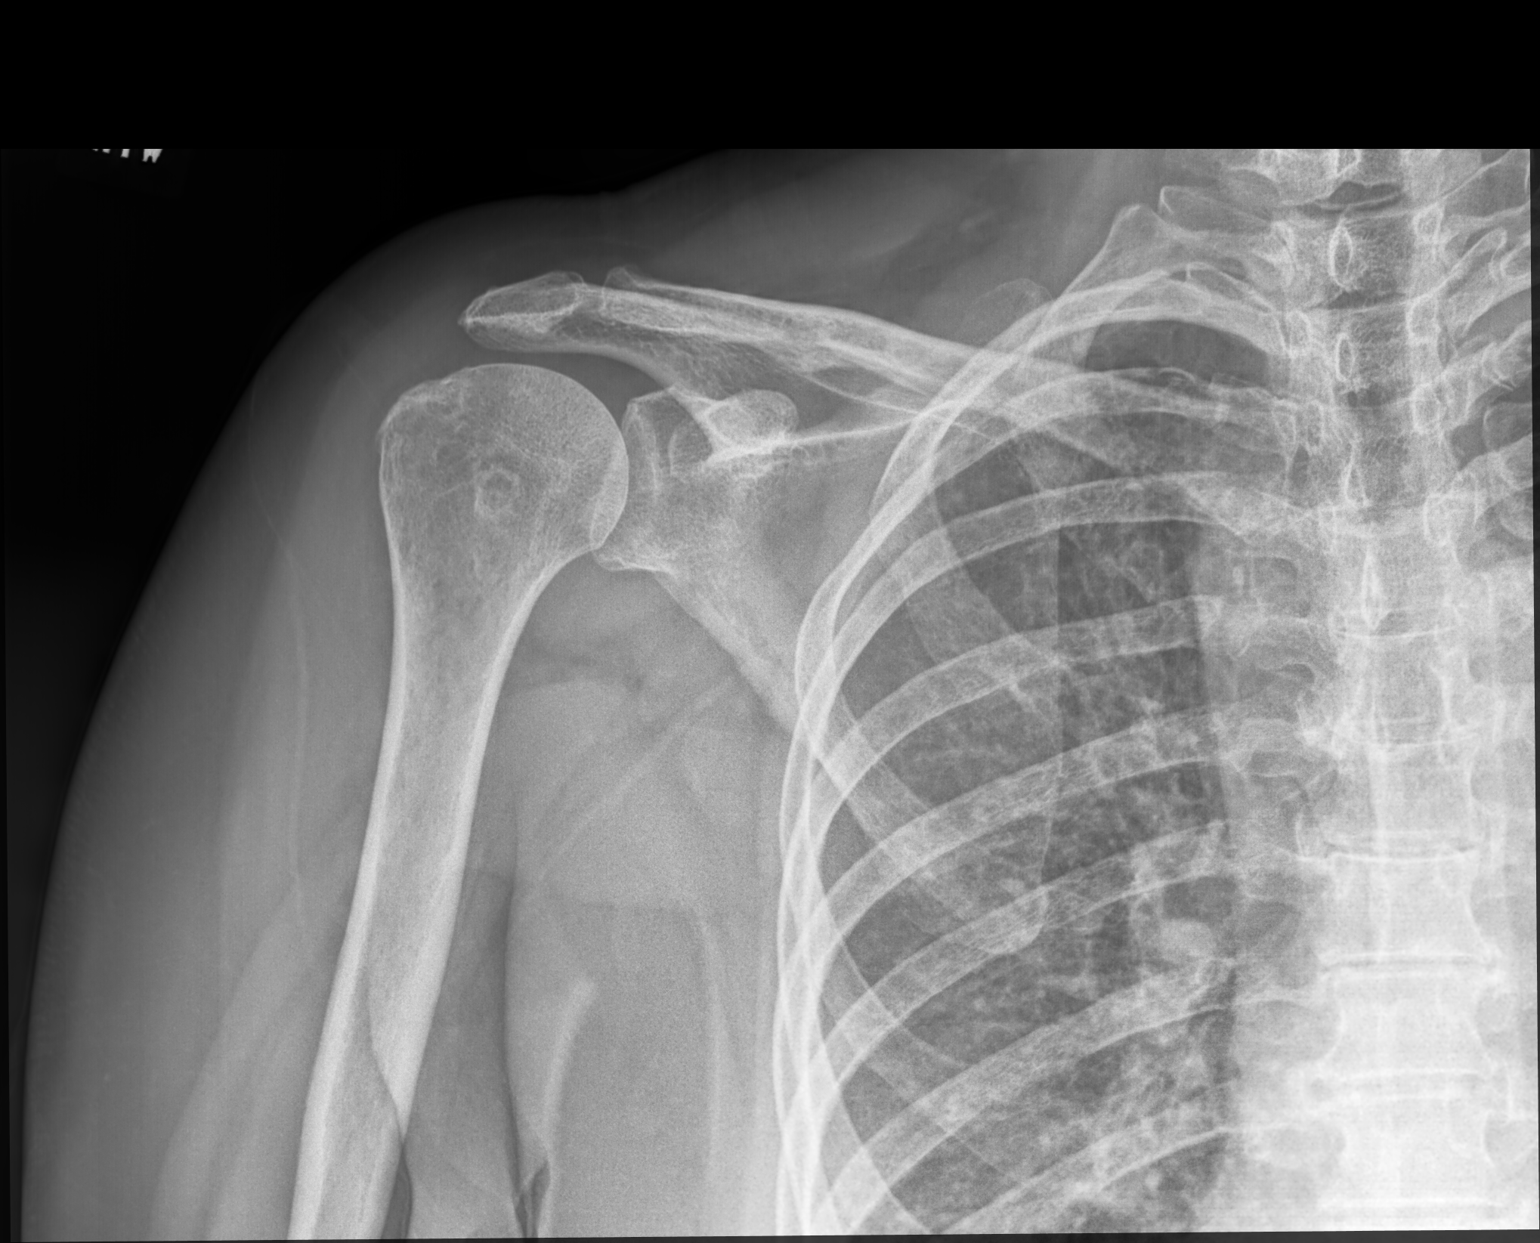

[shoulder y view]
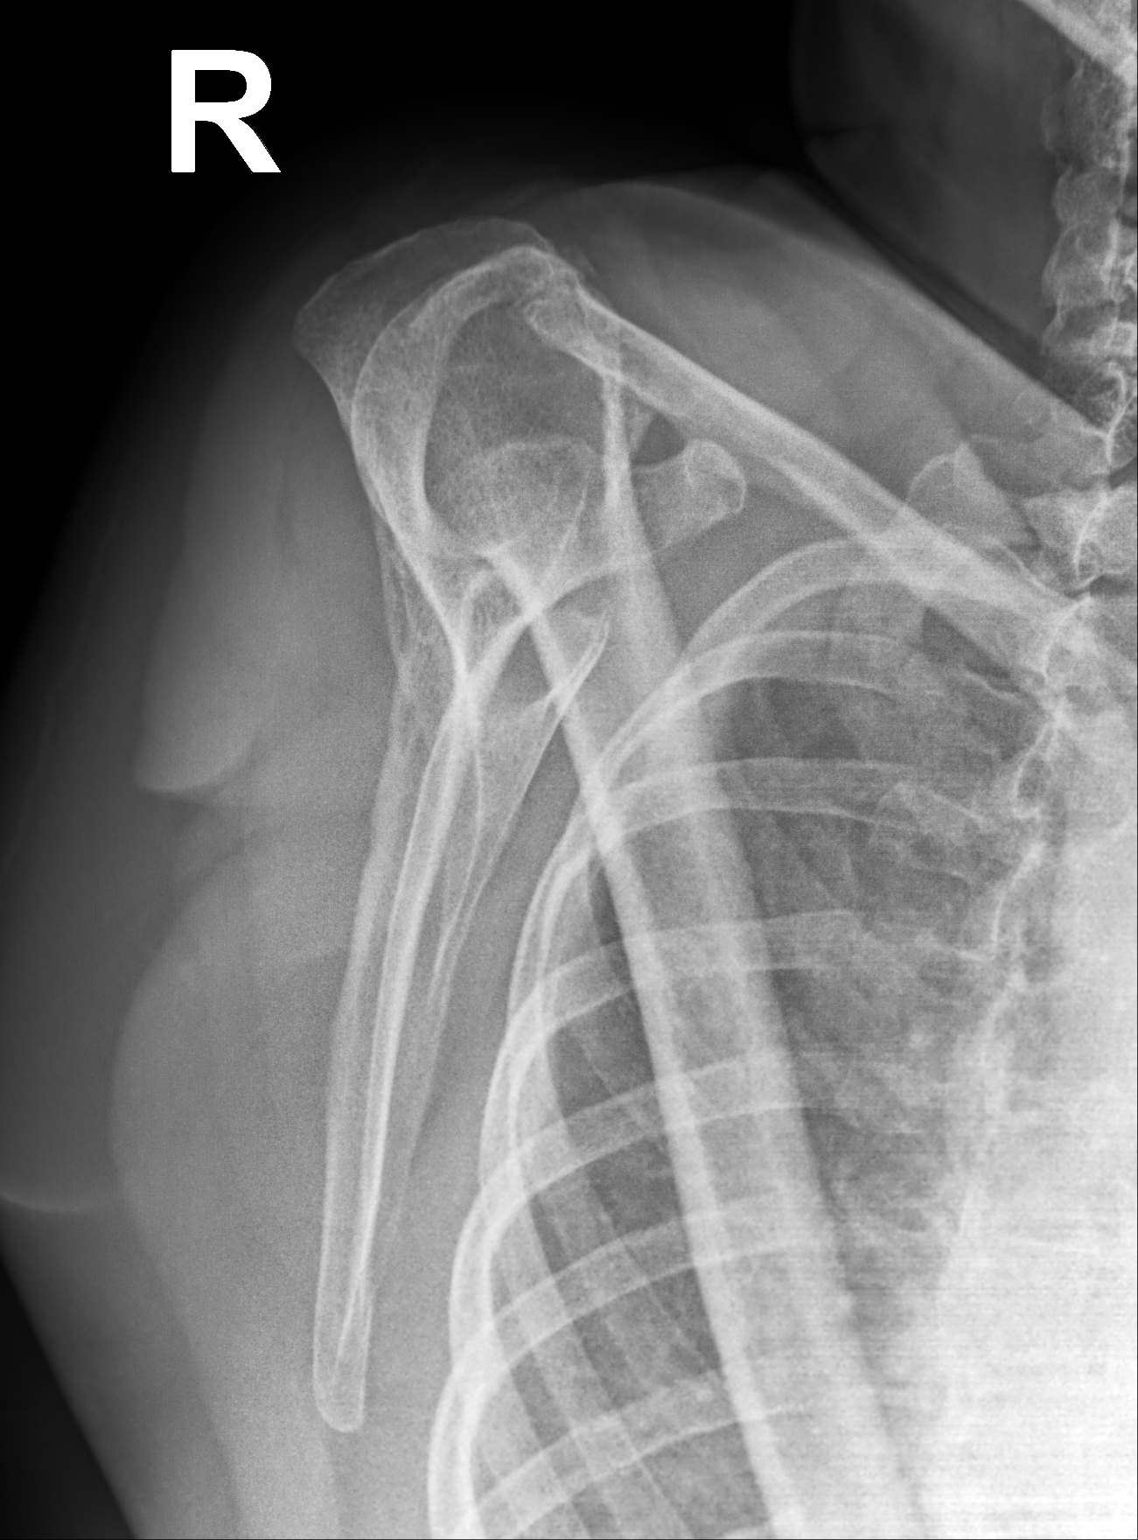

[shoulder axial]
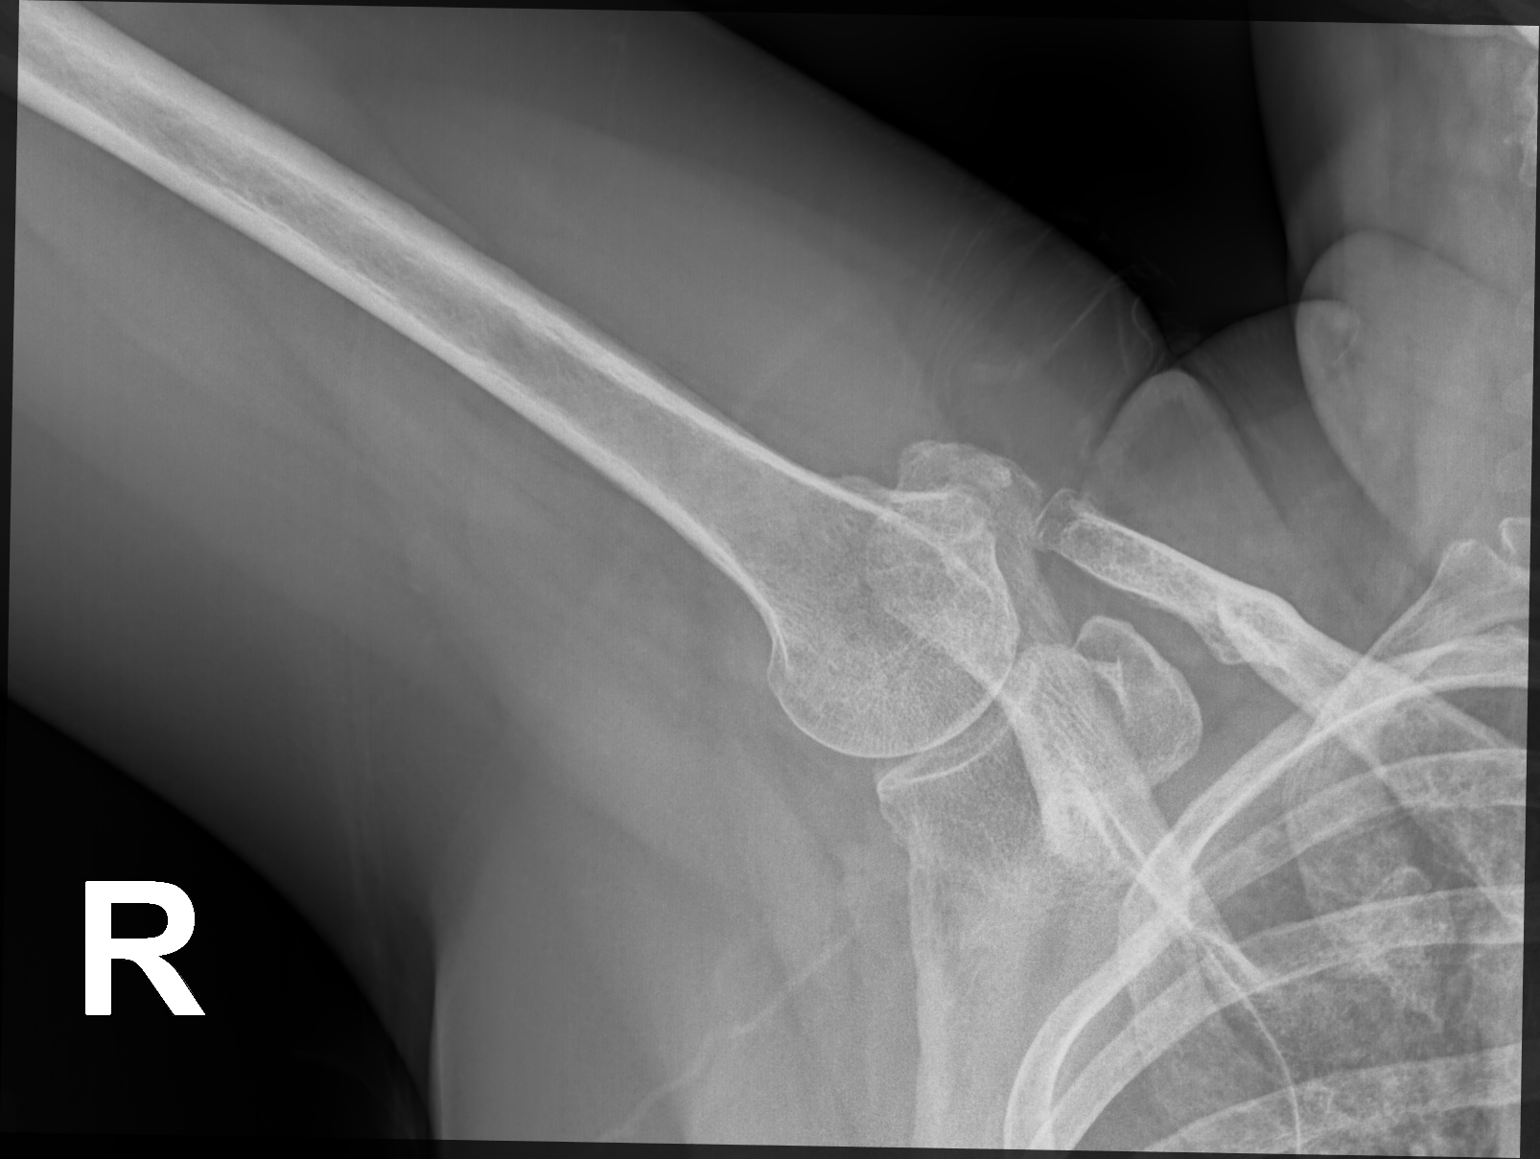

[3 of 3 positions shown; findings below may reference images not displayed]

FINDINGS: There is no evidence of fracture or dislocation. There is no
evidence of arthropathy or other focal bone abnormality. Soft
tissues are unremarkable.
IMPRESSION: Negative.

## 2020-03-13 ENCOUNTER — Ambulatory Visit: Payer: 59 | Admitting: Family Medicine

## 2020-03-18 ENCOUNTER — Other Ambulatory Visit: Payer: Self-pay | Admitting: Family Medicine

## 2020-03-25 ENCOUNTER — Encounter: Payer: Self-pay | Admitting: Family Medicine

## 2020-03-25 ENCOUNTER — Other Ambulatory Visit: Payer: Self-pay

## 2020-03-25 ENCOUNTER — Ambulatory Visit (INDEPENDENT_AMBULATORY_CARE_PROVIDER_SITE_OTHER): Payer: 59 | Admitting: Family Medicine

## 2020-03-25 VITALS — BP 132/74 | HR 80 | Temp 98.2°F | Resp 18 | Ht 60.0 in | Wt 188.0 lb

## 2020-03-25 DIAGNOSIS — E119 Type 2 diabetes mellitus without complications: Secondary | ICD-10-CM | POA: Diagnosis not present

## 2020-03-25 DIAGNOSIS — M7918 Myalgia, other site: Secondary | ICD-10-CM

## 2020-03-25 DIAGNOSIS — M7581 Other shoulder lesions, right shoulder: Secondary | ICD-10-CM | POA: Diagnosis not present

## 2020-03-25 DIAGNOSIS — I10 Essential (primary) hypertension: Secondary | ICD-10-CM | POA: Diagnosis not present

## 2020-03-25 LAB — POCT GLYCOSYLATED HEMOGLOBIN (HGB A1C): Hemoglobin A1C: 6.5 % — AB (ref 4.0–5.6)

## 2020-03-25 MED ORDER — FARXIGA 5 MG PO TABS: 5.0000 mg | ORAL_TABLET | Freq: Every day | ORAL | 11 refills | Status: DC

## 2020-03-25 MED ORDER — MELOXICAM 7.5 MG PO TABS: 7.5000 mg | ORAL_TABLET | Freq: Every day | ORAL | 2 refills | Status: DC | PRN

## 2020-03-25 NOTE — Patient Instructions (Signed)
Please return in 3 months for diabetes follow up on the new medication.   Work on improving your work station; strengthen your deltoid, ice and use mobic as needed.   Please schedule your colonoscopy with Dr. Donavan Burnet office and schedule your mammogram.   [x]   [facility]      [address] [place], [place]        [place]          If you have any questions or concerns, please don't hesitate to send me a message via MyChart or call the office at [phone]. Thank you for visiting with [phone] today! It's our pleasure caring for you.

## 2020-03-25 NOTE — Progress Notes (Signed)
Subjective  CC:  Chief Complaint  Patient presents with  . Shoulder Pain    Right shoulder. 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   . Hypertension  . Diabetes  . Health Maintenance    Mammogram and Colonoscopy will be scheduled since the patient has vaccinated.     HPI: Meagan Harrison is a 64 y.o. female who presents to the office today for follow up of diabetes and problems listed above in the chief complaint.   Diabetes follow up: Her diabetic control is reported as Unchanged. Has been eating a little worse and weight is up again. No sxs of hyperglycemia.  She denies exertional CP or SOB or symptomatic hypoglycemia. She denies foot sores or paresthesias. On glucotrol xl 2.5 and metformin.   HTN: on losartan and bb; c/o leg swelling at end of day now that she is seated at a desk all day long. No cp or sob.   Right shoulder pain: actually resolved after steroid shot in January; however now c/o lateral upper arm pain associated with working. Still sitting at home desk that is too high for her. No pain when she is off on the weekends. Aches by the end of the day. Uses advil or topical nsaid gel with relief. No elbow pain or radicular sxs.   Wt Readings from Last 3 Encounters:  03/25/20 188 lb (85.3 kg)  11/22/19 184 lb 12.8 oz (83.8 kg)  05/07/19 182 lb 3.2 oz (82.6 kg)    BP Readings from Last 3 Encounters:  03/25/20 132/74  11/22/19 124/86  05/07/19 136/78    Assessment  1. Controlled type 2 diabetes mellitus without complication, without long-term current use of insulin (HCC)   2. Essential hypertension   3. Right rotator cuff tendinitis   4. Pain of right deltoid      Plan   Diabetes is currently well controlled. Will change to [id number] for multi system improvement. Educated on mechanism of action and expectations. Continue metformin. Work on diet. Nl foot exam today.   HTN is fairly well controlled. May consider hctz  given leg edema; start farxiga which should also help. Reassess in 3 months.   Shoulder/deltoid pain: RCT is resolved; however deltoid strain/overuse from desk work. Again discussed ergonomics. Discussed ROM exercise and deltoid strengthening. mobic prn.   Follow up: 3 months to recheck dm and HTN. Orders Placed This Encounter  Procedures  . POCT glycosylated hemoglobin (Hb A1C)   Meds ordered this encounter  Medications  . meloxicam (MOBIC) 7.5 MG tablet    Sig: Take 1 tablet (7.5 mg total) by mouth daily as needed for pain.    Dispense:  60 tablet    Refill:  2  . dapagliflozin propanediol (FARXIGA) 5 MG TABS tablet    Sig: Take 5 mg by mouth daily before breakfast.    Dispense:  30 tablet    Refill:  11      Immunization History  Administered Date(s) Administered  . Hepatitis B, ped/adol 02/28/1991  . Influenza Split 08/07/2000, 08/25/2011, 08/23/2015  . Influenza, Seasonal, Injecte, Preservative Fre 08/26/2014, 08/22/2015, 09/03/2016  . Influenza,inj,Quad PF,6+ Mos 08/25/2017, 08/01/2018, 08/08/2019  . PFIZER SARS-COV-2 Vaccination 01/08/2020, 01/29/2020  . PPD Test 11/08/2007  . Pneumococcal Conjugate-13 03/04/2014  . Pneumococcal Polysaccharide-23 01/12/2010  . Td 07/08/2005  . Tdap 01/19/2012  . Zoster Recombinat (Shingrix) 11/14/2018, 05/07/2019    Diabetes Related Lab Review: Lab Results  Component Value Date  HGBA1C 6.5 (A) 03/25/2020   HGBA1C 6.6 (A) 11/22/2019   HGBA1C 6.2 (A) 11/14/2018    Lab Results  Component Value Date   MICROALBUR <0.7 11/22/2019   Lab Results  Component Value Date   CREATININE 0.91 11/22/2019   BUN 13 11/22/2019   NA 141 11/22/2019   K 3.9 11/22/2019   CL 101 11/22/2019   CO2 28 11/22/2019   Lab Results  Component Value Date   CHOL 128 11/22/2019   CHOL 116 11/14/2018   CHOL 116 08/01/2018   Lab Results  Component Value Date   HDL 56.50 11/22/2019   HDL 47.00 11/14/2018   HDL 47.80 08/01/2018   Lab Results   Component Value Date   LDLCALC 49 11/22/2019   LDLCALC 47 11/14/2018   LDLCALC 38 08/01/2018   Lab Results  Component Value Date   TRIG 113.0 11/22/2019   TRIG 113.0 11/14/2018   TRIG 148.0 08/01/2018   Lab Results  Component Value Date   CHOLHDL 2 11/22/2019   CHOLHDL 2 11/14/2018   CHOLHDL 2 08/01/2018   No results found for: LDLDIRECT The ASCVD Risk score Mikey Bussing DC Jr., et al., 2013) failed to calculate for the following reasons:   The valid total cholesterol range is 130 to 320 mg/dL I have reviewed the PMH, Fam and Soc history. Patient Active Problem List   Diagnosis Date Noted  . Mixed hyperlipidemia 01/29/2018    Priority: High  . Diabetes mellitus type 2, controlled, without complications ([place]) 93/81/8299    Priority: High  . Essential hypertension 01/17/2011    Priority: High  . Acquired hypothyroidism 01/17/2011    Priority: High  . Primary osteoarthritis of right knee 03/25/2016    Priority: Medium  . Osteoarthritis, hand 10/18/2011    Priority: Medium    Overview:  righ 1st MCP joint   . GERD (gastroesophageal reflux disease) 01/17/2011    Priority: Medium  . Allergic rhinitis 01/12/2010    Priority: Low    Overview:  10/1 IMO update   . Tricuspid regurgitation 01/12/2010    Priority: Low    Overview:  Mild Tricuspid Regurgitation - echo at [facility]; no further eval needed     Social History: Patient  reports that she has never smoked. She has never used smokeless tobacco. She reports current alcohol use. She reports that she does not use drugs.  Review of Systems: Ophthalmic: negative for eye pain, loss of vision or double vision Cardiovascular: negative for chest pain Respiratory: negative for SOB or persistent cough Gastrointestinal: negative for abdominal pain Genitourinary: negative for dysuria or gross hematuria MSK: negative for foot lesions Neurologic: negative for weakness or gait disturbance  Objective  Vitals: BP 132/74   Pulse 80    Temp 98.2 F (36.8 C) (Temporal)   Resp 18   Ht 5' (1.524 m)   Wt 188 lb (85.3 kg)   SpO2 98%   BMI 36.72 kg/m  General: well appearing, no acute distress  Psych:  Alert and oriented, normal mood and affect HEENT:  Normocephalic, atraumatic, moist mucous membranes, supple neck  Cardiovascular:  Nl S1 and S2, RRR without murmur, gallop or rub. no edema Respiratory:  Good breath sounds bilaterally, CTAB with normal effort, no rales Right shoulder: nl ROM, neg empty can. No ttp. tt over lateral deltoid only.  Nl right elbow. Skin:  Warm, no rashes Neurologic:   Mental status is normal. normal gait Foot exam: no erythema, pallor, or cyanosis visible nl proprioception and sensation  to monofilament testing bilaterally, +2 distal pulses bilaterally   Diabetic education: ongoing education regarding chronic disease management for diabetes was given today. We continue to reinforce the ABC's of diabetic management: A1c (<7 or 8 dependent upon patient), tight blood pressure control, and cholesterol management with goal LDL < 100 minimally. We discuss diet strategies, exercise recommendations, medication options and possible side effects. At each visit, we review recommended immunizations and preventive care recommendations for diabetics and stress that good diabetic control can prevent other problems. See below for this patient's data.    Commons side effects, risks, benefits, and alternatives for medications and treatment plan prescribed today were discussed, and the patient expressed understanding of the given instructions. Patient is instructed to call or message via MyChart if he/she has any questions or concerns regarding our treatment plan. No barriers to understanding were identified. We discussed Red Flag symptoms and signs in detail. Patient expressed understanding regarding what to do in case of urgent or emergency type symptoms.   Medication list was reconciled, printed and provided to the  patient in AVS. Patient instructions and summary information was reviewed with the patient as documented in the AVS. This note was prepared with assistance of Dragon voice recognition software. Occasional wrong-word or sound-a-like substitutions may have occurred due to the inherent limitations of voice recognition software  This visit occurred during the SARS-CoV-2 public health emergency.  Safety protocols were in place, including screening questions prior to the visit, additional usage of staff PPE, and extensive cleaning of exam room while observing appropriate contact time as indicated for disinfecting solutions.

## 2020-04-09 ENCOUNTER — Telehealth: Payer: Self-pay

## 2020-04-09 NOTE — Telephone Encounter (Signed)
Received PA denial for [facility] prescription. Please advise on what medication to send into pharmacy.

## 2020-04-14 NOTE — Telephone Encounter (Signed)
Can see if jardiance is covered. 10mg  daily. Can also have her check with her insurance company to see which GLT2-I is covered.

## 2020-04-15 ENCOUNTER — Other Ambulatory Visit: Payer: Self-pay

## 2020-04-15 MED ORDER — EMPAGLIFLOZIN 10 MG PO TABS
10.0000 mg | ORAL_TABLET | Freq: Every day | ORAL | 3 refills | Status: DC
Start: 2020-04-15 — End: 2021-02-08

## 2020-04-15 NOTE — Telephone Encounter (Signed)
Patient is aware that insurance denied Meagan Harrison and that Meagan Harrison has been sent in

## 2020-05-15 ENCOUNTER — Other Ambulatory Visit: Payer: Self-pay | Admitting: Family Medicine

## 2020-05-25 ENCOUNTER — Ambulatory Visit: Payer: 59 | Admitting: Family Medicine

## 2020-05-28 ENCOUNTER — Ambulatory Visit: Payer: 59 | Admitting: Family Medicine

## 2020-06-08 ENCOUNTER — Other Ambulatory Visit: Payer: Self-pay | Admitting: Family Medicine

## 2020-06-09 LAB — HM MAMMOGRAPHY

## 2020-06-17 ENCOUNTER — Encounter: Payer: Self-pay | Admitting: Family Medicine

## 2020-06-26 ENCOUNTER — Ambulatory Visit: Payer: 59 | Admitting: Family Medicine

## 2020-07-01 ENCOUNTER — Ambulatory Visit: Payer: 59 | Admitting: Family Medicine

## 2020-07-15 ENCOUNTER — Other Ambulatory Visit: Payer: Self-pay

## 2020-07-15 ENCOUNTER — Encounter: Payer: Self-pay | Admitting: Family Medicine

## 2020-07-15 ENCOUNTER — Ambulatory Visit: Payer: 59 | Admitting: Family Medicine

## 2020-07-15 VITALS — BP 122/64 | HR 73 | Temp 97.9°F | Resp 18 | Ht 60.0 in | Wt 177.8 lb

## 2020-07-15 DIAGNOSIS — M7918 Myalgia, other site: Secondary | ICD-10-CM

## 2020-07-15 DIAGNOSIS — Z1211 Encounter for screening for malignant neoplasm of colon: Secondary | ICD-10-CM

## 2020-07-15 DIAGNOSIS — E119 Type 2 diabetes mellitus without complications: Secondary | ICD-10-CM

## 2020-07-15 DIAGNOSIS — Z23 Encounter for immunization: Secondary | ICD-10-CM | POA: Diagnosis not present

## 2020-07-15 DIAGNOSIS — Z1212 Encounter for screening for malignant neoplasm of rectum: Secondary | ICD-10-CM

## 2020-07-15 DIAGNOSIS — E669 Obesity, unspecified: Secondary | ICD-10-CM | POA: Diagnosis not present

## 2020-07-15 DIAGNOSIS — R600 Localized edema: Secondary | ICD-10-CM

## 2020-07-15 DIAGNOSIS — I10 Essential (primary) hypertension: Secondary | ICD-10-CM

## 2020-07-15 LAB — POCT GLYCOSYLATED HEMOGLOBIN (HGB A1C): Hemoglobin A1C: 6.7 % — AB (ref 4.0–5.6)

## 2020-07-15 LAB — HM DIABETES EYE EXAM

## 2020-07-15 NOTE — Progress Notes (Signed)
Subjective  CC:  Chief Complaint  Patient presents with  . Diabetes    HPI: Meagan Harrison is a 64 y.o. female who presents to the office today for follow up of diabetes and problems listed above in the chief complaint.   Diabetes follow up: Her diabetic control is reported as Improved. At last visit we changed from Glucotrol XL 2.5 daily to Jardiance 10 daily.  She is tolerating this well.  No yeast infections or adverse effects.  Her diet has been worse over the last 3 months, mainly due to the summer outings and eating out more, also eating more fruit.  Eating lots of grapes melons..  She has lost 10 pounds, however. She denies exertional CP or SOB or symptomatic hypoglycemia. She denies foot sores or paresthesias.   Hypertension: Control is borderline.  It has responded nicely to the addition of Jardiance.  No chest pain or shortness of breath.  Obesity: Has lost significant amount of weight.  Right shoulder pain is much improved.  Using Mobic intermittently.  Lower extremity edema, dependent is still present, typically after work today.  She is at a seated position.  No lower extremity edema when she is not working and up walking around.  Is not bothersome or painful.  Health maintenance: Overdue for colon cancer screening.  She would like to avoid colonoscopy due to Covid.  Wt Readings from Last 3 Encounters:  07/15/20 177 lb 12.8 oz (80.6 kg)  03/25/20 188 lb (85.3 kg)  11/22/19 184 lb 12.8 oz (83.8 kg)    BP Readings from Last 3 Encounters:  07/15/20 122/64  03/25/20 132/74  11/22/19 124/86    Assessment  1. Controlled type 2 diabetes mellitus without complication, without long-term current use of insulin (HCC)   2. Essential hypertension   3. Obesity (BMI 30-39.9)   4. Pain of right deltoid   5. Screening for colorectal cancer   6. Lower leg edema   7. Need for influenza vaccination      Plan   Diabetes is currently well controlled.  A1c is a bit higher however  weight is down.  She will decrease the amount of fruit in her diet and this should help.  Continue current medications.  Flu shot updated today.  Hypertension is now well controlled.  Continue weight loss  Monitor lower extremity edema.  Plan as needed Lasix if needed but for now we will hold off.  Colon cancer screening, overdue.  Had normal colonoscopy 10 to 11 years ago.  No family history of colon cancer history of colon polyps.  She elects Cologuard.  Instructions given.  Follow up: Return in about 3 months (around 10/14/2020) for follow up of diabetes and hypertension.. Orders Placed This Encounter  Procedures  . Flu Vaccine QUAD 6+ mos PF IM (Fluarix Quad PF)  . Cologuard  . POCT HgB A1C   No orders of the defined types were placed in this encounter.     Immunization History  Administered Date(s) Administered  . Hepatitis B, ped/adol 02/28/1991  . Influenza Split 08/07/2000, 08/25/2011, 08/23/2015  . Influenza, Seasonal, Injecte, Preservative Fre 08/26/2014, 08/22/2015, 09/03/2016  . Influenza,inj,Quad PF,6+ Mos 08/25/2017, 08/01/2018, 08/08/2019, 07/15/2020  . PFIZER SARS-COV-2 Vaccination 01/08/2020, 01/29/2020  . PPD Test 11/08/2007  . Pneumococcal Conjugate-13 03/04/2014  . Pneumococcal Polysaccharide-23 01/12/2010  . Td 07/08/2005  . Tdap 01/19/2012  . Zoster Recombinat (Shingrix) 11/14/2018, 05/07/2019    Diabetes Related Lab Review: Lab Results  Component Value Date  HGBA1C 6.7 (A) 07/15/2020   HGBA1C 6.5 (A) 03/25/2020   HGBA1C 6.6 (A) 11/22/2019    Lab Results  Component Value Date   MICROALBUR <0.7 11/22/2019   Lab Results  Component Value Date   CREATININE 0.91 11/22/2019   BUN 13 11/22/2019   NA 141 11/22/2019   K 3.9 11/22/2019   CL 101 11/22/2019   CO2 28 11/22/2019   Lab Results  Component Value Date   CHOL 128 11/22/2019   CHOL 116 11/14/2018   CHOL 116 08/01/2018   Lab Results  Component Value Date   HDL 56.50 11/22/2019   HDL  47.00 11/14/2018   HDL 47.80 08/01/2018   Lab Results  Component Value Date   LDLCALC 49 11/22/2019   LDLCALC 47 11/14/2018   LDLCALC 38 08/01/2018   Lab Results  Component Value Date   TRIG 113.0 11/22/2019   TRIG 113.0 11/14/2018   TRIG 148.0 08/01/2018   Lab Results  Component Value Date   CHOLHDL 2 11/22/2019   CHOLHDL 2 11/14/2018   CHOLHDL 2 08/01/2018   No results found for: LDLDIRECT The ASCVD Risk score Denman George DC Jr., et al., 2013) failed to calculate for the following reasons:   The valid total cholesterol range is 130 to 320 mg/dL I have reviewed the PMH, Fam and Soc history. Patient Active Problem List   Diagnosis Date Noted  . Mixed hyperlipidemia 01/29/2018    Priority: High  . Diabetes mellitus type 2, controlled, without complications (HCC) 01/17/2011    Priority: High  . Essential hypertension 01/17/2011    Priority: High  . Acquired hypothyroidism 01/17/2011    Priority: High  . Primary osteoarthritis of right knee 03/25/2016    Priority: Medium  . Osteoarthritis, hand 10/18/2011    Priority: Medium    Overview:  righ 1st MCP joint   . GERD (gastroesophageal reflux disease) 01/17/2011    Priority: Medium  . Allergic rhinitis 01/12/2010    Priority: Low    Overview:  10/1 IMO update   . Tricuspid regurgitation 01/12/2010    Priority: Low    Overview:  Mild Tricuspid Regurgitation - echo at [facility]; no further eval needed   . Obesity (BMI 30-39.9) 07/15/2020  . Lower leg edema 07/15/2020    Social History: Patient  reports that she has never smoked. She has never used smokeless tobacco. She reports current alcohol use. She reports that she does not use drugs.  Review of Systems: Ophthalmic: negative for eye pain, loss of vision or double vision Cardiovascular: negative for chest pain Respiratory: negative for SOB or persistent cough Gastrointestinal: negative for abdominal pain Genitourinary: negative for dysuria or gross hematuria MSK:  negative for foot lesions Neurologic: negative for weakness or gait disturbance  Objective  Vitals: BP 122/64   Pulse 73   Temp 97.9 F (36.6 C) (Temporal)   Resp 18   Ht 5' (1.524 m)   Wt 177 lb 12.8 oz (80.6 kg)   SpO2 98%   BMI 34.72 kg/m  General: well appearing, no acute distress  Psych:  Alert and oriented, normal mood and affect Cardiovascular:  Nl S1 and S2, RRR without murmur, gallop or rub. no edema Respiratory:  Good breath sounds bilaterally, CTAB with normal effort, no rales     Diabetic education: ongoing education regarding chronic disease management for diabetes was given today. We continue to reinforce the ABC's of diabetic management: A1c (<7 or 8 dependent upon patient), tight blood pressure control, and cholesterol management  with goal LDL < 100 minimally. We discuss diet strategies, exercise recommendations, medication options and possible side effects. At each visit, we review recommended immunizations and preventive care recommendations for diabetics and stress that good diabetic control can prevent other problems. See below for this patient's data.    Commons side effects, risks, benefits, and alternatives for medications and treatment plan prescribed today were discussed, and the patient expressed understanding of the given instructions. Patient is instructed to call or message via MyChart if he/she has any questions or concerns regarding our treatment plan. No barriers to understanding were identified. We discussed Red Flag symptoms and signs in detail. Patient expressed understanding regarding what to do in case of urgent or emergency type symptoms.   Medication list was reconciled, printed and provided to the patient in AVS. Patient instructions and summary information was reviewed with the patient as documented in the AVS. This note was prepared with assistance of Dragon voice recognition software. Occasional wrong-word or sound-a-like substitutions may have  occurred due to the inherent limitations of voice recognition software  This visit occurred during the SARS-CoV-2 public health emergency.  Safety protocols were in place, including screening questions prior to the visit, additional usage of staff PPE, and extensive cleaning of exam room while observing appropriate contact time as indicated for disinfecting solutions.

## 2020-07-15 NOTE — Patient Instructions (Addendum)
Please return in 3 months for diabetes follow up  Today you were given your Flu vaccination.  Get your covid booster in November.   If you have any questions or concerns, please don't hesitate to send me a message via MyChart or call the office at [phone]. Thank you for visiting with [place] today! It's our pleasure caring for you.  I recommend the Cologuard test for your colon cancer screening that is due. I have ordered this test for you. [facility] will soon contact you to verify your insurance, address etc. They will then send you the kit; follow the instructions in the kit and return the kit to [facility]. They will run the test and send the results to me. I will then give you the results. If this test is negative, we recommend repeating a colon cancer screening test in 3 years. If it is positive, I will refer you to a Gastroenterologist so you can get set up for the recommended colonoscopy.  Thank you!

## 2020-07-20 ENCOUNTER — Encounter: Payer: Self-pay | Admitting: Family Medicine

## 2020-07-20 NOTE — Progress Notes (Signed)
FYI

## 2020-08-13 ENCOUNTER — Other Ambulatory Visit: Payer: Self-pay | Admitting: Family Medicine

## 2020-09-12 ENCOUNTER — Other Ambulatory Visit: Payer: Self-pay | Admitting: Family Medicine

## 2020-10-19 ENCOUNTER — Ambulatory Visit: Payer: 59 | Admitting: Family Medicine

## 2020-11-11 ENCOUNTER — Other Ambulatory Visit: Payer: Self-pay | Admitting: Family Medicine

## 2020-11-18 ENCOUNTER — Encounter: Payer: Self-pay | Admitting: Family Medicine

## 2020-11-18 ENCOUNTER — Ambulatory Visit: Payer: 59 | Admitting: Family Medicine

## 2020-11-18 ENCOUNTER — Other Ambulatory Visit: Payer: Self-pay

## 2020-11-18 VITALS — BP 128/62 | HR 72 | Temp 97.2°F | Resp 18 | Ht 60.0 in | Wt 172.3 lb

## 2020-11-18 DIAGNOSIS — R600 Localized edema: Secondary | ICD-10-CM

## 2020-11-18 DIAGNOSIS — E039 Hypothyroidism, unspecified: Secondary | ICD-10-CM | POA: Diagnosis not present

## 2020-11-18 DIAGNOSIS — K219 Gastro-esophageal reflux disease without esophagitis: Secondary | ICD-10-CM

## 2020-11-18 DIAGNOSIS — I10 Essential (primary) hypertension: Secondary | ICD-10-CM

## 2020-11-18 DIAGNOSIS — E782 Mixed hyperlipidemia: Secondary | ICD-10-CM | POA: Diagnosis not present

## 2020-11-18 DIAGNOSIS — E119 Type 2 diabetes mellitus without complications: Secondary | ICD-10-CM

## 2020-11-18 DIAGNOSIS — L659 Nonscarring hair loss, unspecified: Secondary | ICD-10-CM

## 2020-11-18 LAB — COMPREHENSIVE METABOLIC PANEL
ALT: 18 U/L (ref 0–35)
AST: 19 U/L (ref 0–37)
Albumin: 4.1 g/dL (ref 3.5–5.2)
Alkaline Phosphatase: 67 U/L (ref 39–117)
BUN: 16 mg/dL (ref 6–23)
CO2: 25 mEq/L (ref 19–32)
Calcium: 9.6 mg/dL (ref 8.4–10.5)
Chloride: 103 mEq/L (ref 96–112)
Creatinine, Ser: 0.85 mg/dL (ref 0.40–1.20)
GFR: 72.75 mL/min (ref >60.00)
Glucose, Bld: 107 mg/dL — ABNORMAL HIGH (ref 70–99)
Potassium: 3.7 mEq/L (ref 3.5–5.1)
Sodium: 138 mEq/L (ref 135–145)
Total Bilirubin: 0.5 mg/dL (ref 0.2–1.2)
Total Protein: 6.7 g/dL (ref 6.0–8.3)

## 2020-11-18 LAB — LIPID PANEL
Cholesterol: 135 mg/dL (ref 0–200)
HDL: 56.5 mg/dL (ref >39.00)
LDL Cholesterol: 56 mg/dL (ref 0–99)
NonHDL: 78.68
Total CHOL/HDL Ratio: 2
Triglycerides: 115 mg/dL (ref 0.0–149.0)
VLDL: 23 mg/dL (ref 0.0–40.0)

## 2020-11-18 LAB — CBC WITH DIFFERENTIAL/PLATELET
Basophils Absolute: 0 10*3/uL (ref 0.0–0.1)
Basophils Relative: 0.5 % (ref 0.0–3.0)
Eosinophils Absolute: 0.1 10*3/uL (ref 0.0–0.7)
Eosinophils Relative: 1.6 % (ref 0.0–5.0)
HCT: 35.9 % — ABNORMAL LOW (ref 36.0–46.0)
Hemoglobin: 12.2 g/dL (ref 12.0–15.0)
Lymphocytes Relative: 25.7 % (ref 12.0–46.0)
Lymphs Abs: 1.8 10*3/uL (ref 0.7–4.0)
MCHC: 34.1 g/dL (ref 30.0–36.0)
MCV: 93.8 fl (ref 78.0–100.0)
Monocytes Absolute: 0.5 10*3/uL (ref 0.1–1.0)
Monocytes Relative: 7.3 % (ref 3.0–12.0)
Neutro Abs: 4.6 10*3/uL (ref 1.4–7.7)
Neutrophils Relative %: 64.9 % (ref 43.0–77.0)
Platelets: 248 10*3/uL (ref 150.0–400.0)
RBC: 3.83 Mil/uL — ABNORMAL LOW (ref 3.87–5.11)
RDW: 14.6 % (ref 11.5–15.5)
WBC: 7.1 10*3/uL (ref 4.0–10.5)

## 2020-11-18 LAB — TSH: TSH: 3.54 u[IU]/mL (ref 0.35–4.50)

## 2020-11-18 LAB — POCT GLYCOSYLATED HEMOGLOBIN (HGB A1C): Hemoglobin A1C: 6.5 % — AB (ref 4.0–5.6)

## 2020-11-18 NOTE — Patient Instructions (Signed)
Please return in 6 months for your annual complete physical; please come fasting.   I will release your lab results to you on your MyChart account with further instructions. Please reply with any questions.    If you have any questions or concerns, please don't hesitate to send me a message via MyChart or call the office at [phone]. Thank you for visiting with us today! It's our pleasure caring for you.  

## 2020-11-18 NOTE — Progress Notes (Signed)
Subjective  CC:  Chief Complaint  Patient presents with  . Diabetes    No new concerns.   . Medication Reaction    Patient is concerned that her thyroid medication could be the causes of her losing hair. She stated that it started 3 months ago.     HPI: Meagan Harrison is a 65 y.o. female who presents to the office today for follow up of diabetes and problems listed above in the chief complaint.   Diabetes follow up: Her diabetic control is reported as Unchanged. Control remains good. No symptoms of hypoglycemia. Tolerating all her medications. Diet is good. She denies exertional CP or SOB or symptomatic hypoglycemia. She denies foot sores but admits to mild and intermittent paresthesias. Immunizations are up-to-date. Eye exam is up-to-date. No retinopathy. Weight continues to trend downward  Hypertension: Remains well controlled. No chest pain or shortness of breath.  Bilateral lower extremity edema persists: Dependent but constant. Not on any diuretics currently. No rashes. No calf pain.  Hypothyroidism: Energy level is good. Compliant with medications. Has been well controlled.  Hair thinning: Has noticed increasing hair thinning over the last year. Started after getting the COVID vaccinations. She wonders if this is correlated. No new medications. No rash, scalp itching. Mild thinning diffusely. No patchy hair loss.  Hyperlipidemia on statin: Due for recheck of lipids. No right upper quadrant pain. Tolerates medications well  GERD is well controlled on chronic PPI.   Wt Readings from Last 3 Encounters:  11/18/20 172 lb 4.8 oz (78.2 kg)  07/15/20 177 lb 12.8 oz (80.6 kg)  03/25/20 188 lb (85.3 kg)    BP Readings from Last 3 Encounters:  11/18/20 128/62  07/15/20 122/64  03/25/20 132/74    Assessment  1. Controlled type 2 diabetes mellitus without complication, without long-term current use of insulin (HCC)   2. Acquired hypothyroidism   3. Essential hypertension   4.  Mixed hyperlipidemia   5. Gastroesophageal reflux disease, unspecified whether esophagitis present   6. Lower leg edema   7. Hair thinning      Plan   Diabetes is currently very well controlled. Mild neuropathy symptoms present. Not bothersome. Check lab work.  Hypothyroidism: Due for recheck.  Compliant with medications.  Hypertension is well controlled on current medications.  However, chronic lower extremity edema present.  Check renal function and electrolytes.  Then likely will treat with as needed Lasix and potassium if needed.  Patient understands and agrees with care plan.  Edema: We will start a diuretic, most likely Lasix.  Recommend compression stockings  GERD is controlled.  Hair thinning: Unclear etiology.  Recommend Rogaine.  Check lab work.  Possibly related to COVID vaccinations but uncertain.  Give more time to see if improves again.  No scalp infections identified  Follow up: 6 months for complete physical. Orders Placed This Encounter  Procedures  . CBC with Differential/Platelet  . Comprehensive metabolic panel  . Lipid panel  . TSH  . POCT HgB A1C   No orders of the defined types were placed in this encounter.     Immunization History  Administered Date(s) Administered  . Hepatitis B, ped/adol 02/28/1991  . Influenza Split 08/07/2000, 08/25/2011, 08/23/2015  . Influenza, Seasonal, Injecte, Preservative Fre 08/26/2014, 08/22/2015, 09/03/2016  . Influenza,inj,Quad PF,6+ Mos 08/25/2017, 08/01/2018, 08/08/2019, 07/15/2020  . PFIZER SARS-COV-2 Vaccination 01/08/2020, 01/29/2020, 07/31/2020  . PPD Test 11/08/2007  . Pneumococcal Conjugate-13 03/04/2014  . Pneumococcal Polysaccharide-23 01/12/2010  . Td 07/08/2005  .  Tdap 01/19/2012  . Zoster Recombinat (Shingrix) 11/14/2018, 05/07/2019    Diabetes Related Lab Review: Lab Results  Component Value Date   HGBA1C 6.5 (A) 11/18/2020   HGBA1C 6.7 (A) 07/15/2020   HGBA1C 6.5 (A) 03/25/2020    Lab Results   Component Value Date   MICROALBUR <0.7 11/22/2019   Lab Results  Component Value Date   CREATININE 0.91 11/22/2019   BUN 13 11/22/2019   NA 141 11/22/2019   K 3.9 11/22/2019   CL 101 11/22/2019   CO2 28 11/22/2019   Lab Results  Component Value Date   CHOL 128 11/22/2019   CHOL 116 11/14/2018   CHOL 116 08/01/2018   Lab Results  Component Value Date   HDL 56.50 11/22/2019   HDL 47.00 11/14/2018   HDL 47.80 08/01/2018   Lab Results  Component Value Date   LDLCALC 49 11/22/2019   LDLCALC 47 11/14/2018   LDLCALC 38 08/01/2018   Lab Results  Component Value Date   TRIG 113.0 11/22/2019   TRIG 113.0 11/14/2018   TRIG 148.0 08/01/2018   Lab Results  Component Value Date   CHOLHDL 2 11/22/2019   CHOLHDL 2 11/14/2018   CHOLHDL 2 08/01/2018   No results found for: LDLDIRECT The ASCVD Risk score Denman George DC Jr., et al., 2013) failed to calculate for the following reasons:   The valid total cholesterol range is 130 to 320 mg/dL I have reviewed the PMH, Fam and Soc history. Patient Active Problem List   Diagnosis Date Noted  . Mixed hyperlipidemia 01/29/2018    Priority: High  . Diabetes mellitus type 2, controlled, without complications (HCC) 01/17/2011    Priority: High  . Essential hypertension 01/17/2011    Priority: High  . Acquired hypothyroidism 01/17/2011    Priority: High  . Primary osteoarthritis of right knee 03/25/2016    Priority: Medium  . Osteoarthritis, hand 10/18/2011    Priority: Medium    Overview:  righ 1st MCP joint   . GERD (gastroesophageal reflux disease) 01/17/2011    Priority: Medium  . Allergic rhinitis 01/12/2010    Priority: Low    Overview:  10/1 IMO update   . Tricuspid regurgitation 01/12/2010    Priority: Low    Overview:  Mild Tricuspid Regurgitation - echo at [facility]; no further eval needed   . Obesity (BMI 30-39.9) 07/15/2020  . Lower leg edema 07/15/2020    Social History: Patient  reports that she has never smoked.  She has never used smokeless tobacco. She reports current alcohol use. She reports that she does not use drugs.  Review of Systems: Ophthalmic: negative for eye pain, loss of vision or double vision Cardiovascular: negative for chest pain Respiratory: negative for SOB or persistent cough Gastrointestinal: negative for abdominal pain Genitourinary: negative for dysuria or gross hematuria MSK: negative for foot lesions Neurologic: negative for weakness or gait disturbance  Objective  Vitals: BP 128/62   Pulse 72   Temp (!) 97.2 F (36.2 C) (Temporal)   Resp 18   Ht 5' (1.524 m)   Wt 172 lb 4.8 oz (78.2 kg)   SpO2 98%   BMI 33.65 kg/m  General: well appearing, no acute distress  Psych:  Alert and oriented, normal mood and affect HEENT:  Normocephalic, atraumatic, moist mucous membranes, supple neck  Cardiovascular:  Nl S1 and S2, RRR without murmur, gallop or rub.  +2 pitting bilateral lower extremity edema Respiratory:  Good breath sounds bilaterally, CTAB with normal effort, no rales  Gastrointestinal: normal BS, soft, nontender Skin:  Warm, no rashes Neurologic:   Mental status is normal. normal gait Foot exam: no erythema, pallor, or cyanosis visible nl proprioception and sensation to monofilament testing bilaterally, +2 distal pulses bilaterally    Diabetic education: ongoing education regarding chronic disease management for diabetes was given today. We continue to reinforce the ABC's of diabetic management: A1c (<7 or 8 dependent upon patient), tight blood pressure control, and cholesterol management with goal LDL < 100 minimally. We discuss diet strategies, exercise recommendations, medication options and possible side effects. At each visit, we review recommended immunizations and preventive care recommendations for diabetics and stress that good diabetic control can prevent other problems. See below for this patient's data.    Commons side effects, risks, benefits, and  alternatives for medications and treatment plan prescribed today were discussed, and the patient expressed understanding of the given instructions. Patient is instructed to call or message via MyChart if he/she has any questions or concerns regarding our treatment plan. No barriers to understanding were identified. We discussed Red Flag symptoms and signs in detail. Patient expressed understanding regarding what to do in case of urgent or emergency type symptoms.   Medication list was reconciled, printed and provided to the patient in AVS. Patient instructions and summary information was reviewed with the patient as documented in the AVS. This note was prepared with assistance of Dragon voice recognition software. Occasional wrong-word or sound-a-like substitutions may have occurred due to the inherent limitations of voice recognition software  This visit occurred during the SARS-CoV-2 public health emergency.  Safety protocols were in place, including screening questions prior to the visit, additional usage of staff PPE, and extensive cleaning of exam room while observing appropriate contact time as indicated for disinfecting solutions.

## 2021-02-08 ENCOUNTER — Other Ambulatory Visit: Payer: Self-pay

## 2021-02-08 ENCOUNTER — Encounter: Payer: Self-pay | Admitting: Family Medicine

## 2021-02-08 MED ORDER — LOSARTAN POTASSIUM 100 MG PO TABS: 1.0000 | ORAL_TABLET | Freq: Every day | ORAL | 3 refills | Status: DC

## 2021-02-08 MED ORDER — EMPAGLIFLOZIN 10 MG PO TABS: 10.0000 mg | ORAL_TABLET | Freq: Every day | ORAL | 3 refills | Status: DC

## 2021-02-08 MED ORDER — MELOXICAM 7.5 MG PO TABS: ORAL_TABLET | ORAL | 2 refills | Status: DC

## 2021-02-08 MED ORDER — FLUTICASONE PROPIONATE 50 MCG/ACT NA SUSP
NASAL | 1 refills | Status: AC
Start: 2021-02-08 — End: ?

## 2021-02-08 MED ORDER — METFORMIN HCL 1000 MG PO TABS: 1.0000 | ORAL_TABLET | Freq: Two times a day (BID) | ORAL | 1 refills | Status: DC

## 2021-02-08 MED ORDER — MONTELUKAST SODIUM 10 MG PO TABS: 1.0000 | ORAL_TABLET | Freq: Every day | ORAL | 3 refills | Status: DC

## 2021-02-08 MED ORDER — CETIRIZINE HCL 10 MG PO TABS
10.0000 mg | ORAL_TABLET | Freq: Every day | ORAL | 3 refills | Status: AC | PRN
Start: 2021-02-08 — End: ?

## 2021-02-08 MED ORDER — GLUCOSAMINE-CHONDROITIN 500-400 MG PO TABS: 1.0000 | ORAL_TABLET | Freq: Every day | ORAL | 1 refills | Status: AC

## 2021-02-08 MED ORDER — FAMOTIDINE 20 MG PO TABS: 20.0000 mg | ORAL_TABLET | Freq: Two times a day (BID) | ORAL | 3 refills | Status: DC

## 2021-02-08 MED ORDER — METOPROLOL TARTRATE 50 MG PO TABS
50.0000 mg | ORAL_TABLET | Freq: Two times a day (BID) | ORAL | 3 refills | Status: DC
Start: 2021-02-08 — End: 2021-09-21

## 2021-02-08 MED ORDER — ROSUVASTATIN CALCIUM 10 MG PO TABS: ORAL_TABLET | ORAL | 3 refills | Status: DC

## 2021-02-08 MED ORDER — LEVOTHYROXINE SODIUM 50 MCG PO TABS: 50.0000 ug | ORAL_TABLET | Freq: Every day | ORAL | 3 refills | Status: DC

## 2021-02-09 ENCOUNTER — Other Ambulatory Visit: Payer: Self-pay | Admitting: Family Medicine

## 2021-05-18 ENCOUNTER — Ambulatory Visit (INDEPENDENT_AMBULATORY_CARE_PROVIDER_SITE_OTHER): Payer: 59 | Admitting: Family Medicine

## 2021-05-18 ENCOUNTER — Encounter: Payer: Self-pay | Admitting: Family Medicine

## 2021-05-18 ENCOUNTER — Other Ambulatory Visit: Payer: Self-pay

## 2021-05-18 VITALS — BP 138/74 | HR 79 | Temp 97.1°F | Resp 16 | Wt 169.6 lb

## 2021-05-18 DIAGNOSIS — Z1212 Encounter for screening for malignant neoplasm of rectum: Secondary | ICD-10-CM | POA: Diagnosis not present

## 2021-05-18 DIAGNOSIS — E119 Type 2 diabetes mellitus without complications: Secondary | ICD-10-CM | POA: Diagnosis not present

## 2021-05-18 DIAGNOSIS — Z0000 Encounter for general adult medical examination without abnormal findings: Secondary | ICD-10-CM

## 2021-05-18 DIAGNOSIS — K219 Gastro-esophageal reflux disease without esophagitis: Secondary | ICD-10-CM

## 2021-05-18 DIAGNOSIS — I10 Essential (primary) hypertension: Secondary | ICD-10-CM

## 2021-05-18 DIAGNOSIS — M1712 Unilateral primary osteoarthritis, left knee: Secondary | ICD-10-CM

## 2021-05-18 DIAGNOSIS — R600 Localized edema: Secondary | ICD-10-CM

## 2021-05-18 DIAGNOSIS — Z1211 Encounter for screening for malignant neoplasm of colon: Secondary | ICD-10-CM | POA: Diagnosis not present

## 2021-05-18 DIAGNOSIS — E039 Hypothyroidism, unspecified: Secondary | ICD-10-CM

## 2021-05-18 DIAGNOSIS — E782 Mixed hyperlipidemia: Secondary | ICD-10-CM | POA: Diagnosis not present

## 2021-05-18 DIAGNOSIS — E669 Obesity, unspecified: Secondary | ICD-10-CM

## 2021-05-18 LAB — LIPID PANEL
Cholesterol: 142 mg/dL (ref 0–200)
HDL: 48.5 mg/dL (ref >39.00)
LDL Cholesterol: 57 mg/dL (ref 0–99)
NonHDL: 93.23
Total CHOL/HDL Ratio: 3
Triglycerides: 181 mg/dL — ABNORMAL HIGH (ref 0.0–149.0)
VLDL: 36.2 mg/dL (ref 0.0–40.0)

## 2021-05-18 LAB — CBC WITH DIFFERENTIAL/PLATELET
Basophils Absolute: 0 10*3/uL (ref 0.0–0.1)
Basophils Relative: 0.7 % (ref 0.0–3.0)
Eosinophils Absolute: 0.1 10*3/uL (ref 0.0–0.7)
Eosinophils Relative: 2.3 % (ref 0.0–5.0)
HCT: 38.7 % (ref 36.0–46.0)
Hemoglobin: 13.1 g/dL (ref 12.0–15.0)
Lymphocytes Relative: 29.5 % (ref 12.0–46.0)
Lymphs Abs: 1.7 10*3/uL (ref 0.7–4.0)
MCHC: 33.8 g/dL (ref 30.0–36.0)
MCV: 97.6 fl (ref 78.0–100.0)
Monocytes Absolute: 0.5 10*3/uL (ref 0.1–1.0)
Monocytes Relative: 8.4 % (ref 3.0–12.0)
Neutro Abs: 3.4 10*3/uL (ref 1.4–7.7)
Neutrophils Relative %: 59.1 % (ref 43.0–77.0)
Platelets: 237 10*3/uL (ref 150.0–400.0)
RBC: 3.97 Mil/uL (ref 3.87–5.11)
RDW: 13.5 % (ref 11.5–15.5)
WBC: 5.7 10*3/uL (ref 4.0–10.5)

## 2021-05-18 LAB — COMPREHENSIVE METABOLIC PANEL
ALT: 16 U/L (ref 0–35)
AST: 17 U/L (ref 0–37)
Albumin: 4.3 g/dL (ref 3.5–5.2)
Alkaline Phosphatase: 65 U/L (ref 39–117)
BUN: 20 mg/dL (ref 6–23)
CO2: 28 mEq/L (ref 19–32)
Calcium: 9.7 mg/dL (ref 8.4–10.5)
Chloride: 100 mEq/L (ref 96–112)
Creatinine, Ser: 0.93 mg/dL (ref 0.40–1.20)
GFR: 65.08 mL/min (ref >60.00)
Glucose, Bld: 116 mg/dL — ABNORMAL HIGH (ref 70–99)
Potassium: 4.2 mEq/L (ref 3.5–5.1)
Sodium: 140 mEq/L (ref 135–145)
Total Bilirubin: 0.5 mg/dL (ref 0.2–1.2)
Total Protein: 6.9 g/dL (ref 6.0–8.3)

## 2021-05-18 LAB — TSH: TSH: 3.44 u[IU]/mL (ref 0.35–5.50)

## 2021-05-18 LAB — HEMOGLOBIN A1C: Hgb A1c MFr Bld: 7 % — ABNORMAL HIGH (ref 4.6–6.5)

## 2021-05-18 NOTE — Patient Instructions (Signed)
Please return in 6 months for diabetes and blood pressure recheck.   I will release your lab results to you on your MyChart account with further instructions. Please reply with any questions.    I recommend the Cologuard test for your colon cancer screening that is due. I have ordered this test for you. [facility] will soon contact you to verify your insurance, address etc. They will then send you the kit; follow the instructions in the kit and return the kit to [facility]. They will run the test and send the results to me. I will then give you the results. If this test is negative, we recommend repeating a colon cancer screening test in 3 years. If it is positive, I will refer you to a Gastroenterologist so you can get set up for the recommended colonoscopy.  Thank you!   If you have any questions or concerns, please don't hesitate to send me a message via MyChart or call the office at [phone]. Thank you for visiting with [place] today! It's our pleasure caring for you.   Consider synvisc injection for your right knee if it remains bothersome.

## 2021-05-18 NOTE — Progress Notes (Signed)
Subjective  Chief Complaint  Patient presents with   Annual Exam    Fasting    Diabetes   Hypertension   Hypothyroidism   Health Maintenance    Wanting to discuss getting 4th COVID shot     HPI: Meagan Harrison is a 65 y.o. female who presents to [facility] [place] today for a Female Wellness Visit. She also has the concerns and/or needs as listed above in the chief complaint. These will be addressed in addition to the Health Maintenance Visit.   Wellness Visit: annual visit with health maintenance review and exam without Pap  Health maintenance: Mammogram up-to-date.  Cervical cancer screening up-to-date.  Overdue for colorectal cancer screening.  Had normal colonoscopy in 2009.  Prefers to have Cologuard.  Average risk.  Immunizations are up-to-date.  She retired and is doing well since retirement.  Traveling more.  Chronic disease f/u and/or acute problem visit: (deemed necessary to be done in addition to the wellness visit):  Diabetes follow up: Her diabetic control is reported as Unchanged.  Remains on Jardiance and metformin for diabetes.  Diet is unchanged.  No symptoms of hyperglycemia.  She denies exertional CP or SOB or symptomatic hypoglycemia. She denies foot sores or significant paresthesias.  Eye exam is up-to-date, immunizations are up-to-date.  She remains on an ARB and statin. Hypertension remains well controlled: Takes metoprolol twice daily and losartan daily.  No chest pain, shortness of breath. History of lower extremity edema: Since retirement, she is more active and is no longer having chronic dependent edema. GERD: Well-controlled on daily PPI. Hyperlipidemia and tolerates Crestor 10 mg nightly well.  Goal LDL less than 70. Osteoarthritis left knee: Manages with almost daily meloxicam, glucosamine daily and intermittent Voltaren use.  Has never had any type of knee injection. Hypothyroidism remains clinically well controlled.  Compliant with  medication.  Immunization History  Administered Date(s) Administered   Hepatitis B, ped/adol 02/28/1991   Influenza Split 08/07/2000, 08/25/2011, 08/23/2015   Influenza, Seasonal, Injecte, Preservative Fre 08/26/2014, 08/22/2015, 09/03/2016   Influenza,inj,Quad PF,6+ Mos 08/25/2017, 08/01/2018, 08/08/2019, 07/15/2020   PFIZER(Purple Top)SARS-COV-2 Vaccination 01/08/2020, 01/29/2020, 07/31/2020   PPD Test 11/08/2007   Pneumococcal Conjugate-13 03/04/2014   Pneumococcal Polysaccharide-23 01/12/2010   Td 07/08/2005   Tdap 01/19/2012   Zoster Recombinat (Shingrix) 11/14/2018, 05/07/2019    Diabetes Related Lab Review: Lab Results  Component Value Date   HGBA1C 6.5 (A) 11/18/2020   HGBA1C 6.7 (A) 07/15/2020   HGBA1C 6.5 (A) 03/25/2020    Lab Results  Component Value Date   MICROALBUR <0.7 11/22/2019   Lab Results  Component Value Date   CREATININE 0.85 11/18/2020   BUN 16 11/18/2020   NA 138 11/18/2020   K 3.7 11/18/2020   CL 103 11/18/2020   CO2 25 11/18/2020   Lab Results  Component Value Date   CHOL 135 11/18/2020   CHOL 128 11/22/2019   CHOL 116 11/14/2018   Lab Results  Component Value Date   HDL 56.50 11/18/2020   HDL 56.50 11/22/2019   HDL 47.00 11/14/2018   Lab Results  Component Value Date   LDLCALC 56 11/18/2020   LDLCALC 49 11/22/2019   LDLCALC 47 11/14/2018   Lab Results  Component Value Date   TRIG 115.0 11/18/2020   TRIG 113.0 11/22/2019   TRIG 113.0 11/14/2018   Lab Results  Component Value Date   CHOLHDL 2 11/18/2020   CHOLHDL 2 11/22/2019   CHOLHDL 2 11/14/2018   No  results found for: LDLDIRECT The 10-year ASCVD risk score Denman George DC Montez Hageman., et al., 2013) is: 11.5%   Values used to calculate the score:     Age: 43 years     Sex: Female     Is Non-Hispanic African American: No     Diabetic: Yes     Tobacco smoker: No     Systolic Blood Pressure: 138 mmHg     Is BP treated: Yes     HDL Cholesterol: 56.5 mg/dL     Total Cholesterol:  135 mg/dL  BP Readings from Last 3 Encounters:  05/18/21 138/74  11/18/20 128/62  07/15/20 122/64   Wt Readings from Last 3 Encounters:  05/18/21 169 lb 9.6 oz (76.9 kg)  11/18/20 172 lb 4.8 oz (78.2 kg)  07/15/20 177 lb 12.8 oz (80.6 kg)    Health Maintenance  Topic Date Due   COLONOSCOPY (Pts 45-53yrs Insurance coverage will need to be confirmed)  09/25/2018   HEMOGLOBIN A1C  05/18/2021   COVID-19 Vaccine (4 - Booster for Pfizer series) 06/03/2021 (Originally 11/30/2020)   INFLUENZA VACCINE  06/07/2021   MAMMOGRAM  06/09/2021   OPHTHALMOLOGY EXAM  07/15/2021   FOOT EXAM  11/18/2021   TETANUS/TDAP  01/18/2022   PAP SMEAR-Modifier  11/15/2023   Hepatitis C Screening  Completed   HIV Screening  Completed   Zoster Vaccines- Shingrix  Completed   Pneumococcal Vaccine 85-68 Years old  Aged Out   HPV VACCINES  Aged Out      Assessment  1. Annual physical exam   2. Controlled type 2 diabetes mellitus without complication, without long-term current use of insulin (HCC)   3. Essential hypertension   4. Acquired hypothyroidism   5. Mixed hyperlipidemia   6. Gastroesophageal reflux disease, unspecified whether esophagitis present   7. Lower leg edema   8. Obesity (BMI 30-39.9)      Plan  Female Wellness Visit: Age appropriate Health Maintenance and Prevention measures were discussed with patient. Included topics are cancer screening recommendations, ways to keep healthy (see AVS) including dietary and exercise recommendations, regular eye and dental care, use of seat belts, and avoidance of moderate alcohol use and tobacco use.  Cologuard reordered.  Education given. BMI: discussed patient's BMI and encouraged positive lifestyle modifications to help get to or maintain a target BMI. HM needs and immunizations were addressed and ordered. See below for orders. See HM and immunization section for updates.  Up-to-date Routine labs and screening tests ordered including cmp, cbc and  lipids where appropriate. Discussed recommendations regarding Vit D and calcium supplementation (see AVS)  Chronic disease management visit and/or acute problem visit: Diabetic control has been good for several years.  We will recheck A1c today.  Clinically controlled.  All care measures up-to-date. Hypertension, hyperlipidemia and hypothyroidism remain well controlled clinically.  Recheck lab work today.  No medication changes.  See medication list. Lower extremity edema, dependent: Resolved.  Recommend low-salt diet and activity.  Currently not on a diuretic. GERD on chronic PPI.  Consider B12 supplements. Osteoarthritis left knee: Educated on options of hyaluronic acid supplementation knee injection.  Patient to consider.  Follow up: 6 months for recheck of diabetes and hypertension.   No orders of the defined types were placed in this encounter.  No orders of the defined types were placed in this encounter.     Body mass index is 33.12 kg/m. Wt Readings from Last 3 Encounters:  05/18/21 169 lb 9.6 oz (76.9 kg)  11/18/20  172 lb 4.8 oz (78.2 kg)  07/15/20 177 lb 12.8 oz (80.6 kg)     Patient Active Problem List   Diagnosis Date Noted   Mixed hyperlipidemia 01/29/2018    Priority: High   Diabetes mellitus type 2, controlled, without complications (HCC) 01/17/2011    Priority: High   Essential hypertension 01/17/2011    Priority: High   Acquired hypothyroidism 01/17/2011    Priority: High   Obesity (BMI 30-39.9) 07/15/2020    Priority: Medium   Lower leg edema 07/15/2020    Priority: Medium   Primary osteoarthritis of right knee 03/25/2016    Priority: Medium   Osteoarthritis, hand 10/18/2011    Priority: Medium    Overview:  righ 1st MCP joint     GERD (gastroesophageal reflux disease) 01/17/2011    Priority: Medium   Allergic rhinitis 01/12/2010    Priority: Low    Overview:  10/1 IMO update     Tricuspid regurgitation 01/12/2010    Priority: Low     Overview:  Mild Tricuspid Regurgitation - [facility]; no further eval needed     Health Maintenance  Topic Date Due   COLONOSCOPY (Pts 45-6619yrs Insurance coverage will need to be confirmed)  09/25/2018   HEMOGLOBIN A1C  05/18/2021   COVID-19 Vaccine (4 - Booster for Pfizer series) 06/03/2021 (Originally 11/30/2020)   INFLUENZA VACCINE  06/07/2021   MAMMOGRAM  06/09/2021   OPHTHALMOLOGY EXAM  07/15/2021   FOOT EXAM  11/18/2021   TETANUS/TDAP  01/18/2022   PAP SMEAR-Modifier  11/15/2023   Hepatitis C Screening  Completed   HIV Screening  Completed   Zoster Vaccines- Shingrix  Completed   Pneumococcal Vaccine 300-64 Years old  Aged Out   HPV VACCINES  Aged Out   Immunization History  Administered Date(s) Administered   Hepatitis B, ped/adol 02/28/1991   Influenza Split 08/07/2000, 08/25/2011, 08/23/2015   Influenza, Seasonal, Injecte, Preservative Fre 08/26/2014, 08/22/2015, 09/03/2016   Influenza,inj,Quad PF,6+ Mos 08/25/2017, 08/01/2018, 08/08/2019, 07/15/2020   PFIZER(Purple Top)SARS-COV-2 Vaccination 01/08/2020, 01/29/2020, 07/31/2020   PPD Test 11/08/2007   Pneumococcal Conjugate-13 03/04/2014   Pneumococcal Polysaccharide-23 01/12/2010   Td 07/08/2005   Tdap 01/19/2012   Zoster Recombinat (Shingrix) 11/14/2018, 05/07/2019   We updated and reviewed the patient's past history in detail and it is documented below. Allergies: Patient is allergic to lisinopril. Past Medical History Patient  has a past medical history of Arthritis, Diabetes mellitus (HCC), GERD (gastroesophageal reflux disease), Hyperlipidemia, Hypertension, Thyroid disease, and Tuberculosis. Past Surgical History Patient  has a past surgical history that includes Cesarean section. Family History: Patient family history includes Arthritis in her maternal grandmother and mother; Diabetes in her father; Healthy in her daughter and daughter; Hypertension in her father; Rectal cancer in her mother; Stroke in her  father and maternal grandfather. Social History:  Patient  reports that she has never smoked. She has never used smokeless tobacco. She reports current alcohol use. She reports that she does not use drugs.  Review of Systems: Constitutional: negative for fever or malaise Ophthalmic: negative for photophobia, double vision or loss of vision Cardiovascular: negative for chest pain, dyspnea on exertion, or new LE swelling Respiratory: negative for SOB or persistent cough Gastrointestinal: negative for abdominal pain, change in bowel habits or melena Genitourinary: negative for dysuria or gross hematuria, no abnormal uterine bleeding or disharge Musculoskeletal: negative for new gait disturbance or muscular weakness Integumentary: negative for new or persistent rashes, no breast lumps Neurological: negative for TIA or stroke  symptoms Psychiatric: negative for SI or delusions Allergic/Immunologic: negative for hives  Patient Care Team    Relationship Specialty Notifications Start End  Willow Ora, MD PCP - General Family Medicine  01/29/18   Sallye Lat, MD Consulting Physician Ophthalmology  01/29/18   Marshell Levan    01/29/18    Comment: Dentist  Bernette Redbird, MD Consulting Physician Gastroenterology  03/25/20     Objective  Vitals: BP 138/74   Pulse 79   Temp (!) 97.1 F (36.2 C) (Temporal)   Resp 16   Wt 169 lb 9.6 oz (76.9 kg)   SpO2 98%   BMI 33.12 kg/m  General:  Well developed, well nourished, no acute distress  Psych:  Alert and orientedx3,normal mood and affect HEENT:  Normocephalic, atraumatic, non-icteric sclera,  supple neck without adenopathy, mass or thyromegaly Cardiovascular:  Normal S1, S2, RRR without gallop, rub or murmur Respiratory:  Good breath sounds bilaterally, CTAB with normal respiratory effort Gastrointestinal: normal bowel sounds, soft, non-tender, no noted masses. No HSM MSK: no deformities, contusions. Joints are without erythema or  swelling.  Left knee with crepitus Skin:  Warm, no rashes or suspicious lesions noted Neurologic:    Mental status is normal. CN 2-11 are normal. Gross motor and sensory exams are normal. Normal gait. No tremor Breast Exam: No mass, skin retraction or nipple discharge is appreciated in either breast. No axillary adenopathy. Fibrocystic changes are not noted  Commons side effects, risks, benefits, and alternatives for medications and treatment plan prescribed today were discussed, and the patient expressed understanding of the given instructions. Patient is instructed to call or message via MyChart if he/she has any questions or concerns regarding our treatment plan. No barriers to understanding were identified. We discussed Red Flag symptoms and signs in detail. Patient expressed understanding regarding what to do in case of urgent or emergency type symptoms.  Medication list was reconciled, printed and provided to the patient in AVS. Patient instructions and summary information was reviewed with the patient as documented in the AVS. This note was prepared with assistance of Dragon voice recognition software. Occasional wrong-word or sound-a-like substitutions may have occurred due to the inherent limitations of voice recognition software  This visit occurred during the SARS-CoV-2 public health emergency.  Safety protocols were in place, including screening questions prior to the visit, additional usage of staff PPE, and extensive cleaning of exam room while observing appropriate contact time as indicated for disinfecting solutions.

## 2021-05-26 ENCOUNTER — Other Ambulatory Visit: Payer: Self-pay | Admitting: *Deleted

## 2021-05-26 DIAGNOSIS — Z1211 Encounter for screening for malignant neoplasm of colon: Secondary | ICD-10-CM

## 2021-05-26 DIAGNOSIS — Z0000 Encounter for general adult medical examination without abnormal findings: Secondary | ICD-10-CM

## 2021-05-31 ENCOUNTER — Other Ambulatory Visit: Payer: Self-pay | Admitting: *Deleted

## 2021-06-09 ENCOUNTER — Other Ambulatory Visit: Payer: Self-pay | Admitting: Family Medicine

## 2021-07-20 ENCOUNTER — Other Ambulatory Visit: Payer: Self-pay | Admitting: Family Medicine

## 2021-09-20 ENCOUNTER — Other Ambulatory Visit: Payer: Self-pay | Admitting: Family Medicine

## 2021-10-07 ENCOUNTER — Other Ambulatory Visit: Payer: Self-pay | Admitting: Family Medicine

## 2021-10-19 ENCOUNTER — Other Ambulatory Visit: Payer: Self-pay | Admitting: Family Medicine

## 2021-10-20 ENCOUNTER — Other Ambulatory Visit: Payer: Self-pay | Admitting: Family Medicine

## 2021-10-28 ENCOUNTER — Other Ambulatory Visit: Payer: Self-pay

## 2021-10-28 DIAGNOSIS — Z1212 Encounter for screening for malignant neoplasm of rectum: Secondary | ICD-10-CM

## 2021-11-17 ENCOUNTER — Other Ambulatory Visit: Payer: Self-pay | Admitting: Family Medicine

## 2021-11-23 ENCOUNTER — Ambulatory Visit: Payer: 59 | Admitting: Family Medicine

## 2021-12-01 ENCOUNTER — Encounter: Payer: Self-pay | Admitting: Family Medicine

## 2021-12-01 ENCOUNTER — Ambulatory Visit: Payer: 59 | Admitting: Family Medicine

## 2021-12-01 ENCOUNTER — Other Ambulatory Visit: Payer: Self-pay

## 2021-12-01 VITALS — BP 132/70 | Temp 97.3°F | Ht 61.0 in | Wt 162.6 lb

## 2021-12-01 DIAGNOSIS — Z1211 Encounter for screening for malignant neoplasm of colon: Secondary | ICD-10-CM

## 2021-12-01 DIAGNOSIS — I152 Hypertension secondary to endocrine disorders: Secondary | ICD-10-CM | POA: Diagnosis not present

## 2021-12-01 DIAGNOSIS — E119 Type 2 diabetes mellitus without complications: Secondary | ICD-10-CM | POA: Diagnosis not present

## 2021-12-01 DIAGNOSIS — Z1212 Encounter for screening for malignant neoplasm of rectum: Secondary | ICD-10-CM | POA: Diagnosis not present

## 2021-12-01 DIAGNOSIS — E1159 Type 2 diabetes mellitus with other circulatory complications: Secondary | ICD-10-CM

## 2021-12-01 LAB — POCT GLYCOSYLATED HEMOGLOBIN (HGB A1C): Hemoglobin A1C: 6.2 % — AB (ref 4.0–5.6)

## 2021-12-01 NOTE — Progress Notes (Signed)
Subjective  CC:  Chief Complaint  Patient presents with   Diabetes    HPI: Meagan Harrison is a 66 y.o. female who presents to the office today for follow up of diabetes and problems listed above in the chief complaint.  Diabetes follow up: Her diabetic control is reported as Improved. Eating better now that she is retired. Weight is down as well. More active.  She denies exertional CP or SOB or symptomatic hypoglycemia. She denies foot sores or paresthesias. Eyeexam is nl HTN: Feeling well. Taking medications w/o adverse effects. No symptoms of CHF, angina; no palpitations, sob, cp or lower extremity edema. Compliant with meds.  Cologuard for crc screen but prefers to defer until after she is enrolled in medicare which will be in April of this year.   Wt Readings from Last 3 Encounters:  12/01/21 162 lb 9.6 oz (73.8 kg)  05/18/21 169 lb 9.6 oz (76.9 kg)  11/18/20 172 lb 4.8 oz (78.2 kg)    BP Readings from Last 3 Encounters:  12/01/21 132/70  05/18/21 138/74  11/18/20 128/62     Assessment  1. Controlled type 2 diabetes mellitus without complication, without long-term current use of insulin (HCC)   2. Hypertension associated with diabetes (HCC)   3. Screening for colorectal cancer      Plan  Diabetes is currently very well controlled. Praised for making better lifestyle choices. Continue jardiance 10 daily.  HTN is controlled on arb and bb. Discussed cologuard; to get done in April.  Follow up: 6 months for cpe and recheck. Orders Placed This Encounter  Procedures   Microalbumin / creatinine urine ratio   POCT HgB A1C   No orders of the defined types were placed in this encounter.     Immunization History  Administered Date(s) Administered   Hepatitis B, ped/adol 02/28/1991   Influenza Inj Mdck Quad With Preservative 08/25/2017   Influenza Split 08/07/2000, 08/25/2011, 08/23/2015   Influenza, Seasonal, Injecte, Preservative Fre 08/26/2014, 08/22/2015, 09/03/2016    Influenza,inj,Quad PF,6+ Mos 08/25/2017, 08/01/2018, 08/08/2019, 07/15/2020   Influenza-Unspecified 08/07/2021   PFIZER(Purple Top)SARS-COV-2 Vaccination 01/08/2020, 01/29/2020, 07/31/2020   PPD Test 11/08/2007   Pfizer Covid-19 Vaccine Bivalent Booster 73yrs & up 10/13/2021   Pneumococcal Conjugate-13 03/04/2014   Pneumococcal Polysaccharide-23 01/12/2010   Td 07/08/2005   Tdap 01/19/2012   Zoster Recombinat (Shingrix) 11/14/2018, 05/07/2019    Diabetes Related Lab Review: Lab Results  Component Value Date   HGBA1C 6.2 (A) 12/01/2021   HGBA1C 7.0 (H) 05/18/2021   HGBA1C 6.5 (A) 11/18/2020    Lab Results  Component Value Date   MICROALBUR <0.7 11/22/2019   Lab Results  Component Value Date   CREATININE 0.93 05/18/2021   BUN 20 05/18/2021   NA 140 05/18/2021   K 4.2 05/18/2021   CL 100 05/18/2021   CO2 28 05/18/2021   Lab Results  Component Value Date   CHOL 142 05/18/2021   CHOL 135 11/18/2020   CHOL 128 11/22/2019   Lab Results  Component Value Date   HDL 48.50 05/18/2021   HDL 56.50 11/18/2020   HDL 56.50 11/22/2019   Lab Results  Component Value Date   LDLCALC 57 05/18/2021   LDLCALC 56 11/18/2020   LDLCALC 49 11/22/2019   Lab Results  Component Value Date   TRIG 181.0 (H) 05/18/2021   TRIG 115.0 11/18/2020   TRIG 113.0 11/22/2019   Lab Results  Component Value Date   CHOLHDL 3 05/18/2021   CHOLHDL 2 11/18/2020  CHOLHDL 2 11/22/2019   No results found for: LDLDIRECT The 10-year ASCVD risk score (Arnett DK, et al., 2019) is: 11.7%   Values used to calculate the score:     Age: 40 years     Sex: Female     Is Non-Hispanic African American: No     Diabetic: Yes     Tobacco smoker: No     Systolic Blood Pressure: [id number] mmHg     Is BP treated: Yes     HDL Cholesterol: 48.5 mg/dL     Total Cholesterol: 142 mg/dL I have reviewed the PMH, Fam and Soc history. Patient Active Problem List   Diagnosis Date Noted   Mixed hyperlipidemia 01/29/2018     Priority: High   Diabetes mellitus type 2, controlled, without complications ([place]) 123456    Priority: High   Hypertension associated with diabetes ([place]) 01/17/2011    Priority: High   Acquired hypothyroidism 01/17/2011    Priority: High   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    Priority: Medium     Overview:  righ 1st MCP joint    GERD (gastroesophageal reflux disease) 01/17/2011    Priority: Medium    Allergic rhinitis 01/12/2010    Priority: Low    Overview:  10/1 IMO update    Tricuspid regurgitation 01/12/2010    Priority: Low    Overview:  Mild Tricuspid Regurgitation - echo at [facility]; no further eval needed     Social History: Patient  reports that she has never smoked. She has never used smokeless tobacco. She reports current alcohol use. She reports that she does not use drugs.  Review of Systems: Ophthalmic: negative for eye pain, loss of vision or double vision Cardiovascular: negative for chest pain Respiratory: negative for SOB or persistent cough Gastrointestinal: negative for abdominal pain Genitourinary: negative for dysuria or gross hematuria MSK: negative for foot lesions Neurologic: negative for weakness or gait disturbance  Objective  Vitals: BP 132/70    Temp (!) 97.3 F (36.3 C) (Temporal)    Ht 5\' 1"  (1.549 m)    Wt 162 lb 9.6 oz (73.8 kg)    BMI 30.72 kg/m  General: well appearing, no acute distress  Psych:  Alert and oriented, normal mood and affect HEENT:  Normocephalic, atraumatic, moist mucous membranes, supple neck  Cardiovascular:  Nl S1 and S2, RRR without murmur, gallop or rub. no edema Respiratory:  Good breath sounds bilaterally, CTAB with normal effort, no rales Gastrointestinal: normal BS, soft, nontender Skin:  Warm, no rashes Neurologic:   Mental  status is normal. normal gait Foot exam: no erythema, pallor, or cyanosis visible nl proprioception and sensation to monofilament testing bilaterally, +2 distal pulses bilaterally    Diabetic education: ongoing education regarding chronic disease management for diabetes was given today. We continue to reinforce the ABC's of diabetic management: A1c (<7 or 8 dependent upon patient), tight blood pressure control, and cholesterol management with goal LDL < 100 minimally. We discuss diet strategies, exercise recommendations, medication options and possible side effects. At each visit, we review recommended immunizations and preventive care recommendations for diabetics and stress that good diabetic control can prevent other problems. See below for this patient's data.   Commons side effects, risks, benefits, and alternatives for medications and treatment plan  prescribed today were discussed, and the patient expressed understanding of the given instructions. Patient is instructed to call or message via MyChart if he/she has any questions or concerns regarding our treatment plan. No barriers to understanding were identified. We discussed Red Flag symptoms and signs in detail. Patient expressed understanding regarding what to do in case of urgent or emergency type symptoms.  Medication list was reconciled, printed and provided to the patient in AVS. Patient instructions and summary information was reviewed with the patient as documented in the AVS. This note was prepared with assistance of Dragon voice recognition software. Occasional wrong-word or sound-a-like substitutions may have occurred due to the inherent limitations of voice recognition software  This visit occurred during the SARS-CoV-2 public health emergency.  Safety protocols were in place, including screening questions prior to the visit, additional usage of staff PPE, and extensive cleaning of exam room while observing appropriate contact time as  indicated for disinfecting solutions.

## 2021-12-01 NOTE — Patient Instructions (Signed)
Please return in July for your complete physical with lab work.   Your diabetes looks good!  If you have any questions or concerns, please don't hesitate to send me a message via MyChart or call the office at [phone]. Thank you for visiting with Meagan Harrison today! It's our pleasure caring for you.

## 2021-12-02 LAB — MICROALBUMIN / CREATININE URINE RATIO
Creatinine,U: 70.1 mg/dL
Microalb Creat Ratio: 1.8 mg/g (ref 0.0–30.0)
Microalb, Ur: 1.3 mg/dL (ref 0.0–1.9)

## 2022-01-08 ENCOUNTER — Other Ambulatory Visit: Payer: Self-pay | Admitting: Family Medicine

## 2022-01-17 ENCOUNTER — Other Ambulatory Visit: Payer: Self-pay | Admitting: Family Medicine

## 2022-02-16 ENCOUNTER — Other Ambulatory Visit: Payer: Self-pay | Admitting: Family Medicine

## 2022-02-22 ENCOUNTER — Other Ambulatory Visit: Payer: Self-pay

## 2022-02-22 MED ORDER — METFORMIN HCL 1000 MG PO TABS: 1000.0000 mg | ORAL_TABLET | Freq: Two times a day (BID) | ORAL | 1 refills | Status: DC

## 2022-02-25 DIAGNOSIS — H524 Presbyopia: Secondary | ICD-10-CM | POA: Diagnosis not present

## 2022-04-19 ENCOUNTER — Other Ambulatory Visit: Payer: Self-pay | Admitting: Family Medicine

## 2022-04-22 DIAGNOSIS — Z1211 Encounter for screening for malignant neoplasm of colon: Secondary | ICD-10-CM | POA: Diagnosis not present

## 2022-04-22 DIAGNOSIS — Z1212 Encounter for screening for malignant neoplasm of rectum: Secondary | ICD-10-CM | POA: Diagnosis not present

## 2022-04-29 LAB — COLOGUARD: COLOGUARD: NEGATIVE

## 2022-05-31 ENCOUNTER — Encounter: Payer: 59 | Admitting: Family Medicine

## 2022-06-24 ENCOUNTER — Ambulatory Visit (INDEPENDENT_AMBULATORY_CARE_PROVIDER_SITE_OTHER): Payer: Medicare Other | Admitting: Family Medicine

## 2022-06-24 ENCOUNTER — Encounter: Payer: Self-pay | Admitting: Family Medicine

## 2022-06-24 VITALS — BP 126/68 | HR 77 | Temp 98.4°F | Ht 61.0 in | Wt 169.2 lb

## 2022-06-24 DIAGNOSIS — Z0000 Encounter for general adult medical examination without abnormal findings: Secondary | ICD-10-CM

## 2022-06-24 DIAGNOSIS — E039 Hypothyroidism, unspecified: Secondary | ICD-10-CM | POA: Diagnosis not present

## 2022-06-24 DIAGNOSIS — M1712 Unilateral primary osteoarthritis, left knee: Secondary | ICD-10-CM

## 2022-06-24 DIAGNOSIS — Z23 Encounter for immunization: Secondary | ICD-10-CM

## 2022-06-24 DIAGNOSIS — E782 Mixed hyperlipidemia: Secondary | ICD-10-CM | POA: Diagnosis not present

## 2022-06-24 DIAGNOSIS — I071 Rheumatic tricuspid insufficiency: Secondary | ICD-10-CM

## 2022-06-24 DIAGNOSIS — E1159 Type 2 diabetes mellitus with other circulatory complications: Secondary | ICD-10-CM | POA: Diagnosis not present

## 2022-06-24 DIAGNOSIS — E119 Type 2 diabetes mellitus without complications: Secondary | ICD-10-CM

## 2022-06-24 DIAGNOSIS — I152 Hypertension secondary to endocrine disorders: Secondary | ICD-10-CM

## 2022-06-24 DIAGNOSIS — Z780 Asymptomatic menopausal state: Secondary | ICD-10-CM

## 2022-06-24 DIAGNOSIS — Z1231 Encounter for screening mammogram for malignant neoplasm of breast: Secondary | ICD-10-CM

## 2022-06-24 DIAGNOSIS — K219 Gastro-esophageal reflux disease without esophagitis: Secondary | ICD-10-CM

## 2022-06-24 LAB — CBC WITH DIFFERENTIAL/PLATELET
Basophils Absolute: 0 10*3/uL (ref 0.0–0.1)
Basophils Relative: 0.9 % (ref 0.0–3.0)
Eosinophils Absolute: 0.1 10*3/uL (ref 0.0–0.7)
Eosinophils Relative: 2.6 % (ref 0.0–5.0)
HCT: 36.3 % (ref 36.0–46.0)
Hemoglobin: 12.2 g/dL (ref 12.0–15.0)
Lymphocytes Relative: 30.4 % (ref 12.0–46.0)
Lymphs Abs: 1.6 10*3/uL (ref 0.7–4.0)
MCHC: 33.7 g/dL (ref 30.0–36.0)
MCV: 94.1 fl (ref 78.0–100.0)
Monocytes Absolute: 0.4 10*3/uL (ref 0.1–1.0)
Monocytes Relative: 7.6 % (ref 3.0–12.0)
Neutro Abs: 3.1 10*3/uL (ref 1.4–7.7)
Neutrophils Relative %: 58.5 % (ref 43.0–77.0)
Platelets: 213 10*3/uL (ref 150.0–400.0)
RBC: 3.86 Mil/uL — ABNORMAL LOW (ref 3.87–5.11)
RDW: 14.6 % (ref 11.5–15.5)
WBC: 5.2 10*3/uL (ref 4.0–10.5)

## 2022-06-24 LAB — LIPID PANEL
Cholesterol: 154 mg/dL (ref 0–200)
HDL: 51.9 mg/dL (ref >39.00)
LDL Cholesterol: 72 mg/dL (ref 0–99)
NonHDL: 101.85
Total CHOL/HDL Ratio: 3
Triglycerides: 149 mg/dL (ref 0.0–149.0)
VLDL: 29.8 mg/dL (ref 0.0–40.0)

## 2022-06-24 LAB — COMPREHENSIVE METABOLIC PANEL
ALT: 14 U/L (ref 0–35)
AST: 15 U/L (ref 0–37)
Albumin: 4.2 g/dL (ref 3.5–5.2)
Alkaline Phosphatase: 66 U/L (ref 39–117)
BUN: 21 mg/dL (ref 6–23)
CO2: 27 mEq/L (ref 19–32)
Calcium: 9.5 mg/dL (ref 8.4–10.5)
Chloride: 104 mEq/L (ref 96–112)
Creatinine, Ser: 0.86 mg/dL (ref 0.40–1.20)
GFR: 70.94 mL/min (ref >60.00)
Glucose, Bld: 120 mg/dL — ABNORMAL HIGH (ref 70–99)
Potassium: 4.4 mEq/L (ref 3.5–5.1)
Sodium: 143 mEq/L (ref 135–145)
Total Bilirubin: 0.5 mg/dL (ref 0.2–1.2)
Total Protein: 6.9 g/dL (ref 6.0–8.3)

## 2022-06-24 LAB — TSH: TSH: 2.12 u[IU]/mL (ref 0.35–5.50)

## 2022-06-24 LAB — HEMOGLOBIN A1C: Hgb A1c MFr Bld: 7.4 % — ABNORMAL HIGH (ref 4.6–6.5)

## 2022-06-24 LAB — MICROALBUMIN / CREATININE URINE RATIO
Creatinine,U: 57.7 mg/dL
Microalb Creat Ratio: 1.2 mg/g (ref 0.0–30.0)
Microalb, Ur: <0.7 mg/dL (ref 0.0–1.9)

## 2022-06-24 MED ORDER — EMPAGLIFLOZIN 10 MG PO TABS: 10.0000 mg | ORAL_TABLET | Freq: Every day | ORAL | 3 refills | Status: DC

## 2022-06-24 MED ORDER — FAMOTIDINE 20 MG PO TABS: 20.0000 mg | ORAL_TABLET | Freq: Two times a day (BID) | ORAL | 3 refills | Status: AC

## 2022-06-24 NOTE — Progress Notes (Signed)
Subjective  Chief Complaint  Patient presents with   Knee Pain   Annual Exam    Pt here for Annual Exam and is currently fasting and also some lt knee pain     HPI: Meagan Harrison is a 66 y.o. female who presents to [facility] [place] today for a Female Wellness Visit. She also has the concerns and/or needs as listed above in the chief complaint. These will be addressed in addition to the Health Maintenance Visit.   Wellness Visit: annual visit with health maintenance review and exam without Pap  HM: due mammo and dexa. Eligible for prevnar and flu. Eye exam utd. Crc screen current.  Chronic disease f/u and/or acute problem visit: (deemed necessary to be done in addition to the wellness visit): DM: fastings are checked intermittently and avg 120s-130s. Diet is fair. On jardiance 25 and met 1000 bid. No complications.  HTN on bb and ARB and well controlled. Feeling well. Taking medications w/o adverse effects. No symptoms of CHF, angina; no palpitations, sob, cp or lower extremity edema. Compliant with meds.  HLD on crestor 10 and tolerates well. Fasting today.  Low thyroid on levothyroxine w/o complaints. Energy is good. No sxs of high or low thyroid. Compliant.  GERD and allergies are controlled.  C/o persistnet left knee pain, mild. Uses voltaren gel and mobic. Known OA. Has seen ortho in past/ had h/o meniscal tear as well. No locking giveway or swelling.   Assessment  1. Annual physical exam   2. Controlled type 2 diabetes mellitus without complication, without long-term current use of insulin (HCC)   3. Hypertension associated with diabetes (HCC)   4. Mixed hyperlipidemia   5. Acquired hypothyroidism   6. Asymptomatic menopausal state   7. Encounter for screening mammogram for breast cancer   8. Tricuspid valve insufficiency, unspecified etiology   9. Primary osteoarthritis of left knee   10. Gastroesophageal reflux disease, unspecified whether esophagitis  present      Plan  Female Wellness Visit: Age appropriate Health Maintenance and Prevention measures were discussed with patient. Included topics are cancer screening recommendations, ways to keep healthy (see AVS) including dietary and exercise recommendations, regular eye and dental care, use of seat belts, and avoidance of moderate alcohol use and tobacco use. Pt to schedule mammo and dex BMI: discussed patient's BMI and encouraged positive lifestyle modifications to help get to or maintain a target BMI. HM needs and immunizations were addressed and ordered. See below for orders. See HM and immunization section for updates. Prevnar 20 given today. Return for flu Routine labs and screening tests ordered including cmp, cbc and lipids where appropriate. Discussed recommendations regarding Vit D and calcium supplementation (see AVS)  Chronic disease management visit and/or acute problem visit: DM: check control with a1c. Refilled jardiance 25 and met 1000 bid. Check urine nephropathy screen. No retinopathy. On arb. Imms updated today HLD recheck fasting lipids on crestor 10. Check lfts. Well tolerated HTN is controlled on metoprolol 25 bid and cozaar 100 daily. Check renal function and electrolytes. Low thyroid: clinically stable on levothyroxine 50 daily. Recheck tsh.  Tricuspid regurg: remains asymptomatic OA knee, left: discussed care: otc meds, voltaren, ice prn mobic prn. She will return if flares.  GERD is controlled on otc pepcid bid.  Allergies are treated with montelukast.   Follow up: Return in about 6 months (around 12/25/2022) for follow up of diabetes and hypertension.  Orders Placed This Encounter  Procedures  DG Bone Density   MM DIGITAL SCREENING BILATERAL   Hemoglobin A1c   CBC with Differential/Platelet   Comp Met (CMET)   Lipid Profile   TSH   Microalbumin / creatinine urine ratio   Meds ordered this encounter  Medications   empagliflozin (JARDIANCE) 10 MG TABS  tablet    Sig: Take 1 tablet (10 mg total) by mouth daily before breakfast.    Dispense:  90 tablet    Refill:  3   famotidine (PEPCID) 20 MG tablet    Sig: Take 1 tablet (20 mg total) by mouth 2 (two) times daily.    Dispense:  180 tablet    Refill:  3      Body mass index is 31.97 kg/m. Wt Readings from Last 3 Encounters:  06/24/22 169 lb 3.2 oz (76.7 kg)  12/01/21 162 lb 9.6 oz (73.8 kg)  05/18/21 169 lb 9.6 oz (76.9 kg)     Patient Active Problem List   Diagnosis Date Noted   Mixed hyperlipidemia 01/29/2018    Priority: High   Diabetes mellitus type 2, controlled, without complications ([place]) 36/46/8032    Priority: High   Hypertension associated with diabetes ([place]) 01/17/2011    Priority: High   Acquired hypothyroidism 01/17/2011    Priority: High   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    Priority: Medium     Overview:  righ 1st MCP joint    GERD (gastroesophageal reflux disease) 01/17/2011    Priority: Medium    Allergic rhinitis 01/12/2010    Priority: Low    Overview:  10/1 IMO update    Tricuspid regurgitation 01/12/2010    Priority: Low    Overview:  Mild Tricuspid Regurgitation - echo at [facility]; no further eval needed    Health Maintenance  Topic Date Due   Pneumonia Vaccine 33+ Years old (3 - PPSV23 or PCV20) 02/21/2022   DEXA SCAN  02/21/2022   MAMMOGRAM  06/23/2022   HEMOGLOBIN A1C  05/31/2022   INFLUENZA VACCINE  06/07/2022   COVID-19 Vaccine (5 - Pfizer series) 07/10/2022 (Originally 02/21/2022)   TETANUS/TDAP  06/25/2023 (Originally 01/18/2022)   OPHTHALMOLOGY EXAM  08/25/2022   FOOT EXAM  06/25/2023   PAP SMEAR-Modifier  11/15/2023   Fecal DNA (Cologuard)  04/22/2025   Hepatitis C Screening  Completed   HIV Screening  Completed   Zoster Vaccines- Shingrix  Completed    HPV VACCINES  Aged Out   COLONOSCOPY (Pts 45-25yrs Insurance coverage will need to be confirmed)  Discontinued   Immunization History  Administered Date(s) Administered   Hepatitis B, ped/adol 02/28/1991   Influenza Inj Mdck Quad With Preservative 08/25/2017   Influenza Split 08/07/2000, 08/25/2011, 08/23/2015   Influenza, Seasonal, Injecte, Preservative Fre 08/26/2014, 08/22/2015, 09/03/2016   Influenza,inj,Quad PF,6+ Mos 08/25/2017, 08/01/2018, 08/08/2019, 07/15/2020   Influenza-Unspecified 08/07/2021   PFIZER(Purple Top)SARS-COV-2 Vaccination 01/08/2020, 01/29/2020, 07/31/2020   PPD Test 11/08/2007   Pfizer Covid-19 Vaccine Bivalent Booster 54yrs & up 10/13/2021   Pneumococcal Conjugate-13 03/04/2014   Pneumococcal Polysaccharide-23 01/12/2010   Td 07/08/2005   Tdap 01/19/2012   Zoster Recombinat (Shingrix) 11/14/2018, 05/07/2019   We updated and reviewed the patient's past history in detail and it is documented below. Allergies: Patient is allergic to lisinopril. Past Medical History Patient  has a  past medical history of Arthritis, Diabetes mellitus ([place]), GERD (gastroesophageal reflux disease), Hyperlipidemia, Hypertension, Thyroid disease, and Tuberculosis. Past Surgical History Patient  has a past surgical history that includes Cesarean section. Family History: Patient family history includes Arthritis in her maternal grandmother and mother; Diabetes in her father; Healthy in her daughter and daughter; Hypertension in her father; Rectal cancer in her mother; Stroke in her father and maternal grandfather. Social History:  Patient  reports that she has never smoked. She has never used smokeless tobacco. She reports current alcohol use. She reports that she does not use drugs.  Review of Systems: Constitutional: negative for fever or malaise Ophthalmic: negative for photophobia, double vision or loss of vision Cardiovascular: negative for chest pain, dyspnea on exertion, or  new LE swelling Respiratory: negative for SOB or persistent cough Gastrointestinal: negative for abdominal pain, change in bowel habits or melena Genitourinary: negative for dysuria or gross hematuria, no abnormal uterine bleeding or disharge Musculoskeletal: negative for new gait disturbance or muscular weakness Integumentary: negative for new or persistent rashes, no breast lumps Neurological: negative for TIA or stroke symptoms Psychiatric: negative for SI or delusions Allergic/Immunologic: negative for hives  Patient Care Team    Relationship Specialty Notifications Start End  Leamon Arnt, MD PCP - General Family Medicine  01/29/18   Warden Fillers, MD Consulting Physician Ophthalmology  01/29/18   Ayesha Mohair    01/29/18    Comment: Dentist  Ronald Lobo, MD Consulting Physician Gastroenterology  03/25/20     Objective  Vitals: BP 126/68   Pulse 77   Temp 98.4 F (36.9 C)   Ht $R'5\' 1"'Hj$  (1.549 m)   Wt 169 lb 3.2 oz (76.7 kg)   SpO2 96%   BMI 31.97 kg/m  General:  Well developed, well nourished, no acute distress  Psych:  Alert and orientedx3,normal mood and affect HEENT:  Normocephalic, atraumatic, non-icteric sclera,  supple neck without adenopathy, mass or thyromegaly Cardiovascular:  Normal S1, S2, RRR without gallop, rub or murmur Respiratory:  Good breath sounds bilaterally, CTAB with normal respiratory effort Gastrointestinal: normal bowel sounds, soft, non-tender, no noted masses. No HSM MSK: no deformities, contusions. Joints are without erythema or swelling. Left knee with crepitus Skin:  Warm, no rashes or suspicious lesions noted Diabetic Foot Exam: Appearance - no lesions, ulcers or calluses Skin - no sigificant pallor or erythema Monofilament testing - sensitive bilaterally in following locations:  Right - Great toe, medial, central, lateral ball and posterior foot intact  Left - Great toe, medial, central, lateral ball and posterior foot  intact Pulses - +2 distally bilaterally   Commons side effects, risks, benefits, and alternatives for medications and treatment plan prescribed today were discussed, and the patient expressed understanding of the given instructions. Patient is instructed to call or message via MyChart if he/she has any questions or concerns regarding our treatment plan. No barriers to understanding were identified. We discussed Red Flag symptoms and signs in detail. Patient expressed understanding regarding what to do in case of urgent or emergency type symptoms.  Medication list was reconciled, printed and provided to the patient in AVS. Patient instructions and summary information was reviewed with the patient as documented in the AVS. This note was prepared with assistance of Dragon voice recognition software. Occasional wrong-word or sound-a-like substitutions may have occurred due to the inherent limitations of voice recognition software  This visit occurred during the SARS-CoV-2 public health emergency.  Safety protocols were in place, including screening questions  prior to the visit, additional usage of staff PPE, and extensive cleaning of exam room while observing appropriate contact time as indicated for disinfecting solutions.

## 2022-06-24 NOTE — Addendum Note (Signed)
Addended by: Trudie Reed A on: 06/24/2022 09:15 AM   Modules accepted: Orders

## 2022-06-24 NOTE — Patient Instructions (Signed)
Please return in 6 months to recheck blood pressure and diabetes.   I will release your lab results to you on your MyChart account with further instructions. You may see the results before I do, but when I review them I will send you a message with my report or have my assistant call you if things need to be discussed. Please reply to my message with any questions. Thank you!   Today you were given your Prevnar 20 vaccination.  This protects against pneumonia infections. You may return for your flu shot.   If you have any questions or concerns, please don't hesitate to send me a message via MyChart or call the office at [phone]. Thank you for visiting with [place] today! It's our pleasure caring for you.   I have ordered a mammogram and/or bone density for you as we discussed today: [x]   Mammogram  [x]   Bone Density  Please call the office checked below to schedule your appointment:  [x]   [facility]      [address] [place],        [address] Wing         []   [facility]  [address] [place],  [facility]

## 2022-06-26 ENCOUNTER — Other Ambulatory Visit: Payer: Self-pay | Admitting: Family Medicine

## 2022-06-29 ENCOUNTER — Other Ambulatory Visit: Payer: Self-pay

## 2022-06-29 MED ORDER — EMPAGLIFLOZIN 25 MG PO TABS: 25.0000 mg | ORAL_TABLET | Freq: Every day | ORAL | 1 refills | Status: DC

## 2022-07-07 ENCOUNTER — Other Ambulatory Visit: Payer: Self-pay | Admitting: Family Medicine

## 2022-07-07 DIAGNOSIS — E2839 Other primary ovarian failure: Secondary | ICD-10-CM

## 2022-07-07 DIAGNOSIS — Z0000 Encounter for general adult medical examination without abnormal findings: Secondary | ICD-10-CM

## 2022-08-01 ENCOUNTER — Encounter: Payer: Self-pay | Admitting: *Deleted

## 2022-08-21 ENCOUNTER — Other Ambulatory Visit: Payer: Self-pay | Admitting: Family Medicine

## 2022-09-09 ENCOUNTER — Other Ambulatory Visit: Payer: Self-pay | Admitting: Family Medicine

## 2022-09-09 DIAGNOSIS — E2839 Other primary ovarian failure: Secondary | ICD-10-CM

## 2022-09-21 ENCOUNTER — Encounter: Payer: Self-pay | Admitting: Family Medicine

## 2022-09-21 ENCOUNTER — Other Ambulatory Visit: Payer: Self-pay | Admitting: Family Medicine

## 2022-09-21 ENCOUNTER — Ambulatory Visit (INDEPENDENT_AMBULATORY_CARE_PROVIDER_SITE_OTHER): Payer: Medicare Other | Admitting: Family Medicine

## 2022-09-21 VITALS — BP 132/74 | HR 67 | Temp 97.8°F | Ht 61.0 in | Wt 165.8 lb

## 2022-09-21 DIAGNOSIS — M1712 Unilateral primary osteoarthritis, left knee: Secondary | ICD-10-CM | POA: Diagnosis not present

## 2022-09-21 DIAGNOSIS — I152 Hypertension secondary to endocrine disorders: Secondary | ICD-10-CM | POA: Diagnosis not present

## 2022-09-21 DIAGNOSIS — Z7185 Encounter for immunization safety counseling: Secondary | ICD-10-CM | POA: Diagnosis not present

## 2022-09-21 DIAGNOSIS — E1159 Type 2 diabetes mellitus with other circulatory complications: Secondary | ICD-10-CM | POA: Diagnosis not present

## 2022-09-21 DIAGNOSIS — E119 Type 2 diabetes mellitus without complications: Secondary | ICD-10-CM

## 2022-09-21 LAB — POCT GLYCOSYLATED HEMOGLOBIN (HGB A1C): Hemoglobin A1C: 6.3 % — AB (ref 4.0–5.6)

## 2022-09-21 MED ORDER — METOPROLOL TARTRATE 50 MG PO TABS
50.0000 mg | ORAL_TABLET | Freq: Two times a day (BID) | ORAL | 3 refills | Status: DC
Start: 2022-09-21 — End: 2022-09-26

## 2022-09-21 MED ORDER — ROSUVASTATIN CALCIUM 10 MG PO TABS: 10.0000 mg | ORAL_TABLET | Freq: Every day | ORAL | 3 refills | Status: DC

## 2022-09-21 MED ORDER — LOSARTAN POTASSIUM 100 MG PO TABS: 100.0000 mg | ORAL_TABLET | Freq: Every day | ORAL | 3 refills | Status: DC

## 2022-09-21 NOTE — Progress Notes (Signed)
Subjective  CC:  Chief Complaint  Patient presents with   Diabetes    HPI: Meagan Harrison is a 66 y.o. female who presents to the office today for follow up of diabetes and problems listed above in the chief complaint.  Diabetes follow up: Her diabetic control is reported as Improved. We increased dose of jardiance from 10-25 3 months ago and she is doing well on that. Continues with metformin bid. Eating better. Weigh is down a few pounds.  She denies exertional CP or SOB or symptomatic hypoglycemia. She denies foot sores or paresthesias.  HTN: has been well controlled. Mildly elevated in office today: ? Related to stress. Outside office readings have been normal. She checks at work. No cp or sob.  Left knee OA: uses meloxicam daily, prn voltaren and advil. ? Nsaid is elevating bp.  Had covid in September. Discussed timing of next covid booster and RSV vaccination recs.   Wt Readings from Last 3 Encounters:  09/21/22 165 lb 12.8 oz (75.2 kg)  06/24/22 169 lb 3.2 oz (76.7 kg)  12/01/21 162 lb 9.6 oz (73.8 kg)    BP Readings from Last 3 Encounters:  09/21/22 132/74  06/24/22 126/68  12/01/21 132/70    Assessment  1. Controlled type 2 diabetes mellitus without complication, without long-term current use of insulin ([place])   2. Hypertension associated with diabetes ([place])   3. Primary osteoarthritis of left knee   4. Vaccine counseling      Plan  Diabetes is currently very well controlled. Continue jardiance 25, met bid, and diet. Rec exercise over the winter months.  HTN: controlled but will monitor as outpt. Continue losartan 100 and lopressor 50.  OA knee: avoid daily nsaids if possible. Consider xray/ortho/synvisc.   Follow up: 3-6 months for dm and htn check. Orders Placed This Encounter  Procedures   POCT HgB A1C   Meds ordered this encounter  Medications   rosuvastatin (CRESTOR) 10 MG tablet    Sig: Take 1 tablet (10 mg total) by mouth at bedtime.    Dispense:  90  tablet    Refill:  3   metoprolol tartrate (LOPRESSOR) 50 MG tablet    Sig: Take 1 tablet (50 mg total) by mouth 2 (two) times daily.    Dispense:  180 tablet    Refill:  3   losartan (COZAAR) 100 MG tablet    Sig: Take 1 tablet (100 mg total) by mouth daily.    Dispense:  90 tablet    Refill:  3      Immunization History  Administered Date(s) Administered   Hepatitis B, PED/ADOLESCENT 02/28/1991   Influenza Inj Mdck Quad With Preservative 08/25/2017   Influenza Split 08/07/2000, 08/25/2011, 08/23/2015   Influenza, Seasonal, Injecte, Preservative Fre 08/26/2014, 08/22/2015, 09/03/2016   Influenza,inj,Quad PF,6+ Mos 08/25/2017, 08/01/2018, 08/08/2019, 07/15/2020, 08/06/2022   Influenza-Unspecified 08/07/2021   PFIZER(Purple Top)SARS-COV-2 Vaccination 01/08/2020, 01/29/2020, 07/31/2020   PNEUMOCOCCAL CONJUGATE-20 06/24/2022   PPD Test 11/08/2007   Pfizer Covid-19 Vaccine Bivalent Booster 71yr & up 10/13/2021   Pneumococcal Conjugate-13 03/04/2014   Pneumococcal Polysaccharide-23 01/12/2010   Td 07/08/2005   Tdap 01/19/2012   Zoster Recombinat (Shingrix) 11/14/2018, 05/07/2019    Diabetes Related Lab Review: Lab Results  Component Value Date   HGBA1C 6.3 (A) 09/21/2022   HGBA1C 7.4 (H) 06/24/2022   HGBA1C 6.2 (A) 12/01/2021    Lab Results  Component Value Date   MICROALBUR <0.7 06/24/2022   Lab Results  Component Value  Date   CREATININE 0.86 06/24/2022   BUN 21 06/24/2022   NA 143 06/24/2022   K 4.4 06/24/2022   CL 104 06/24/2022   CO2 27 06/24/2022   Lab Results  Component Value Date   CHOL 154 06/24/2022   CHOL 142 05/18/2021   CHOL 135 11/18/2020   Lab Results  Component Value Date   HDL 51.90 06/24/2022   HDL 48.50 05/18/2021   HDL 56.50 11/18/2020   Lab Results  Component Value Date   LDLCALC 72 06/24/2022   LDLCALC 57 05/18/2021   LDLCALC 56 11/18/2020   Lab Results  Component Value Date   TRIG 149.0 06/24/2022   TRIG 181.0 (H) 05/18/2021    TRIG 115.0 11/18/2020   Lab Results  Component Value Date   CHOLHDL 3 06/24/2022   CHOLHDL 3 05/18/2021   CHOLHDL 2 11/18/2020   No results found for: "LDLDIRECT" The 10-year ASCVD risk score (Arnett DK, et al., 2019) is: 13.3%   Values used to calculate the score:     Age: 2 years     Sex: Female     Is Non-Hispanic African American: No     Diabetic: Yes     Tobacco smoker: No     Systolic Blood Pressure: 355 mmHg     Is BP treated: Yes     HDL Cholesterol: 51.9 mg/dL     Total Cholesterol: 154 mg/dL I have reviewed the PMH, Fam and Soc history. Patient Active Problem List   Diagnosis Date Noted   Mixed hyperlipidemia 01/29/2018    Priority: High   Diabetes mellitus type 2, controlled, without complications ([place]) 73/22/0254    Priority: High   Hypertension associated with diabetes ([place]) 01/17/2011    Priority: High   Acquired hypothyroidism 01/17/2011    Priority: High   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    Priority: Medium     Overview:  righ 1st MCP joint    GERD (gastroesophageal reflux disease) 01/17/2011    Priority: Medium    Allergic rhinitis 01/12/2010    Priority: Low    Overview:  10/1 IMO update    Tricuspid regurgitation 01/12/2010    Priority: Low    Overview:  Mild Tricuspid Regurgitation - echo at [facility]; no further eval needed     Social History: Patient  reports that she has never smoked. She has never used smokeless tobacco. She reports current alcohol use. She reports that she does not use drugs.  Review of Systems: Ophthalmic: negative for eye pain, loss of vision or double vision Cardiovascular: negative for chest pain Respiratory: negative for SOB or persistent cough Gastrointestinal: negative for abdominal pain Genitourinary: negative for dysuria or gross  hematuria MSK: negative for foot lesions Neurologic: negative for weakness or gait disturbance  Objective  Vitals: BP 132/74 Comment: by recent check outside of the office  Pulse 67   Temp 97.8 F (36.6 C)   Ht _0  (1.549 m)   Wt 165 lb 12.8 oz (75.2 kg)   SpO2 99%   BMI 31.33 kg/m  General: well appearing, no acute distress  Psych:  Alert and oriented, normal mood and affect HEENT:  Normocephalic, atraumatic, moist mucous membranes, supple neck  Cardiovascular:  Nl S1 and S2, RRR with murmur, gallop or rub.  no edema Respiratory:  Good breath sounds bilaterally, CTAB with normal effort, no rales     Diabetic education: ongoing education regarding chronic disease management for diabetes was given today. We continue to reinforce the ABC's of diabetic management: A1c (<7 or 8 dependent upon patient), tight blood pressure control, and cholesterol management with goal LDL < 100 minimally. We discuss diet strategies, exercise recommendations, medication options and possible side effects. At each visit, we review recommended immunizations and preventive care recommendations for diabetics and stress that good diabetic control can prevent other problems. See below for this patient's data.   Commons side effects, risks, benefits, and alternatives for medications and treatment plan prescribed today were discussed, and the patient expressed understanding of the given instructions. Patient is instructed to call or message via MyChart if he/she has any questions or concerns regarding our treatment plan. No barriers to understanding were identified. We discussed Red Flag symptoms and signs in detail. Patient expressed understanding regarding what to do in case of urgent or emergency type symptoms.  Medication list was reconciled, printed and provided to the patient in AVS. Patient instructions and summary information was reviewed with the patient as documented in the AVS. This note was prepared with  assistance of Dragon voice recognition software. Occasional wrong-word or sound-a-like substitutions may have occurred due to the inherent limitations of voice recognition software

## 2022-09-21 NOTE — Patient Instructions (Signed)
Please return in 3-6 months to recheck diabetes and blood pressure.   Please monitor your blood pressure at work and send me notice if it remains elevated > 135/85.   Decrease the meloxicam if you can  If you have any questions or concerns, please don't hesitate to send me a message via MyChart or call the office at [phone]. Thank you for visiting with [place] today! It's our pleasure caring for you.

## 2022-09-23 ENCOUNTER — Other Ambulatory Visit: Payer: Self-pay | Admitting: Family Medicine

## 2022-09-26 ENCOUNTER — Other Ambulatory Visit: Payer: Self-pay

## 2022-09-26 ENCOUNTER — Telehealth: Payer: Self-pay | Admitting: Family Medicine

## 2022-09-26 MED ORDER — METOPROLOL TARTRATE 50 MG PO TABS: 50.0000 mg | ORAL_TABLET | Freq: Two times a day (BID) | ORAL | 3 refills | Status: DC

## 2022-09-26 NOTE — Telephone Encounter (Signed)
Rx sent 

## 2022-09-26 NOTE — Telephone Encounter (Signed)
Pt's rx was not sent to pharmacy. Please advise  MEDICATION: metoprolol tartrate (LOPRESSOR) 50 MG tablet

## 2022-09-27 ENCOUNTER — Other Ambulatory Visit: Payer: Self-pay | Admitting: Family Medicine

## 2022-11-15 ENCOUNTER — Ambulatory Visit
Admission: RE | Admit: 2022-11-15 | Discharge: 2022-11-15 | Disposition: A | Discharge status: Home / Self Care | Payer: Medicare Other | Source: Ambulatory Visit | Attending: Family Medicine | Admitting: Family Medicine

## 2022-11-15 DIAGNOSIS — Z1231 Encounter for screening mammogram for malignant neoplasm of breast: Secondary | ICD-10-CM | POA: Diagnosis not present

## 2022-12-30 ENCOUNTER — Encounter: Payer: Self-pay | Admitting: Family Medicine

## 2022-12-30 ENCOUNTER — Ambulatory Visit (INDEPENDENT_AMBULATORY_CARE_PROVIDER_SITE_OTHER): Payer: Medicare Other | Admitting: Family Medicine

## 2022-12-30 VITALS — BP 128/64 | HR 65 | Temp 97.8°F | Ht 61.0 in | Wt 165.2 lb

## 2022-12-30 DIAGNOSIS — I152 Hypertension secondary to endocrine disorders: Secondary | ICD-10-CM | POA: Diagnosis not present

## 2022-12-30 DIAGNOSIS — E119 Type 2 diabetes mellitus without complications: Secondary | ICD-10-CM

## 2022-12-30 DIAGNOSIS — E1159 Type 2 diabetes mellitus with other circulatory complications: Secondary | ICD-10-CM | POA: Diagnosis not present

## 2022-12-30 LAB — POCT GLYCOSYLATED HEMOGLOBIN (HGB A1C): Hemoglobin A1C: 6.5 % — AB (ref 4.0–5.6)

## 2022-12-30 MED ORDER — EMPAGLIFLOZIN 25 MG PO TABS: 25.0000 mg | ORAL_TABLET | Freq: Every day | ORAL | 3 refills | Status: DC

## 2022-12-30 NOTE — Progress Notes (Signed)
Subjective  CC:  Chief Complaint  Patient presents with   Diabetes   Hypertension    HPI: Meagan Harrison is a 67 y.o. female who presents to the office today for follow up of diabetes and problems listed above in the chief complaint.  Diabetes follow up: Her diabetic control is reported as Unchanged. Doing well. Eating well. No concerns. Overdue for eye exam; had to reschedule due to her daughter having surgery.  She denies exertional CP or SOB or symptomatic hypoglycemia. She denies foot sores or paresthesias.  HTN: stable on losartan 100 daily. Feeling well. Taking medications w/o adverse effects. No symptoms of CHF, angina; no palpitations, sob, cp or lower extremity edema. Compliant with meds.   Wt Readings from Last 3 Encounters:  12/30/22 165 lb 3.2 oz (74.9 kg)  09/21/22 165 lb 12.8 oz (75.2 kg)  06/24/22 169 lb 3.2 oz (76.7 kg)    BP Readings from Last 3 Encounters:  12/30/22 130/64  09/21/22 132/74  06/24/22 126/68    Assessment  1. Controlled type 2 diabetes mellitus without complication, without long-term current use of insulin ([place])   2. Hypertension associated with diabetes ([place])      Plan  Diabetes is currently well controlled. No changes HTN: well controlled.   Follow up: 74mofor cpe. Orders Placed This Encounter  Procedures   POCT HgB A1C   No orders of the defined types were placed in this encounter.     Immunization History  Administered Date(s) Administered   Hepatitis B, PED/ADOLESCENT 02/28/1991   Influenza Inj Mdck Quad With Preservative 08/25/2017   Influenza Split 08/07/2000, 08/25/2011, 08/23/2015   Influenza, Seasonal, Injecte, Preservative Fre 08/26/2014, 08/22/2015, 09/03/2016   Influenza,inj,Quad PF,6+ Mos 08/25/2017, 08/01/2018, 08/08/2019, 07/15/2020, 08/06/2022   Influenza-Unspecified 08/07/2021   PFIZER(Purple Top)SARS-COV-2 Vaccination 01/08/2020, 01/29/2020, 07/31/2020   PNEUMOCOCCAL CONJUGATE-20 06/24/2022   PPD Test 11/08/2007    Pfizer Covid-19 Vaccine Bivalent Booster 133yr& up 10/13/2021   Pneumococcal Conjugate-13 03/04/2014   Pneumococcal Polysaccharide-23 01/12/2010   Td 07/08/2005   Tdap 01/19/2012   Zoster Recombinat (Shingrix) 11/14/2018, 05/07/2019    Diabetes Related Lab Review: Lab Results  Component Value Date   HGBA1C 6.5 (A) 12/30/2022   HGBA1C 6.3 (A) 09/21/2022   HGBA1C 7.4 (H) 06/24/2022    Lab Results  Component Value Date   MICROALBUR <0.7 06/24/2022   Lab Results  Component Value Date   CREATININE 0.86 06/24/2022   BUN 21 06/24/2022   NA 143 06/24/2022   K 4.4 06/24/2022   CL 104 06/24/2022   CO2 27 06/24/2022   Lab Results  Component Value Date   CHOL 154 06/24/2022   CHOL 142 05/18/2021   CHOL 135 11/18/2020   Lab Results  Component Value Date   HDL 51.90 06/24/2022   HDL 48.50 05/18/2021   HDL 56.50 11/18/2020   Lab Results  Component Value Date   LDLCALC 72 06/24/2022   LDLCALC 57 05/18/2021   LDLCALC 56 11/18/2020   Lab Results  Component Value Date   TRIG 149.0 06/24/2022   TRIG 181.0 (H) 05/18/2021   TRIG 115.0 11/18/2020   Lab Results  Component Value Date   CHOLHDL 3 06/24/2022   CHOLHDL 3 05/18/2021   CHOLHDL 2 11/18/2020   No results found for: "LDLDIRECT" The 10-year ASCVD risk score (Arnett DK, et al., 2019) is: 12.9%   Values used to calculate the score:     Age: [age]     Sex: Female  Is Non-Hispanic African American: No     Diabetic: Yes     Tobacco smoker: No     Systolic Blood Pressure: [id number] mmHg     Is BP treated: Yes     HDL Cholesterol: 51.9 mg/dL     Total Cholesterol: 154 mg/dL I have reviewed the PMH, Fam and Soc history. Patient Active Problem List   Diagnosis Date Noted   Mixed hyperlipidemia 01/29/2018    Priority: High   Diabetes mellitus type 2, controlled, without complications ([place]) 123456    Priority: High   Hypertension associated with diabetes ([place]) 01/17/2011    Priority: High   Acquired  hypothyroidism 01/17/2011    Priority: High   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    Priority: Medium     Overview:  righ 1st MCP joint    GERD (gastroesophageal reflux disease) 01/17/2011    Priority: Medium    Allergic rhinitis 01/12/2010    Priority: Low    Overview:  10/1 IMO update    Tricuspid regurgitation 01/12/2010    Priority: Low    Overview:  Mild Tricuspid Regurgitation - echo at [facility]; no further eval needed     Social History: Patient  reports that she has never smoked. She has never used smokeless tobacco. She reports current alcohol use. She reports that she does not use drugs.  Review of Systems: Ophthalmic: negative for eye pain, loss of vision or double vision Cardiovascular: negative for chest pain Respiratory: negative for SOB or persistent cough Gastrointestinal: negative for abdominal pain Genitourinary: negative for dysuria or gross hematuria MSK: negative for foot lesions Neurologic: negative for weakness or gait disturbance  Objective  Vitals: BP 130/64   Pulse 65   Temp 97.8 F (36.6 C)   Ht '5\' 1"'$  (1.549 m)   Wt 165 lb 3.2 oz (74.9 kg)   SpO2 96%   BMI 31.21 kg/m  General: well appearing, no acute distress  Psych:  Alert and oriented, normal mood and affect Cardiovascular:  Nl S1 and S2, RRR without murmur, gallop or rub. no edema Respiratory:  Good breath sounds bilaterally, CTAB with normal effort, no rales   Diabetic education: ongoing education regarding chronic disease management for diabetes was given today. We continue to reinforce the ABC's of diabetic management: A1c (<7 or 8 dependent upon patient), tight blood pressure control, and cholesterol management with goal LDL < 100 minimally. We discuss diet strategies, exercise recommendations, medication options  and possible side effects. At each visit, we review recommended immunizations and preventive care recommendations for diabetics and stress that good diabetic control can prevent other problems. See below for this patient's data.   Commons side effects, risks, benefits, and alternatives for medications and treatment plan prescribed today were discussed, and the patient expressed understanding of the given instructions. Patient is instructed to call or message via MyChart if he/she has any questions or concerns regarding our treatment plan. No barriers to understanding were identified. We discussed Red Flag symptoms and signs in detail. Patient expressed understanding regarding what to do in case of urgent or emergency type symptoms.  Medication list was reconciled, printed and provided to the patient in AVS. Patient instructions and summary information was reviewed with the patient as documented in the AVS. This note was prepared  with assistance of Systems analyst. Occasional wrong-word or sound-a-like substitutions may have occurred due to the inherent limitations of voice recognition software

## 2022-12-30 NOTE — Patient Instructions (Signed)
Please return in 6 months for your annual complete physical; please come fasting.   If you have any questions or concerns, please don't hesitate to send me a message via MyChart or call the office at [phone]. Thank you for visiting with us today! It's our pleasure caring for you.  

## 2023-02-14 DIAGNOSIS — E119 Type 2 diabetes mellitus without complications: Secondary | ICD-10-CM | POA: Diagnosis not present

## 2023-02-14 DIAGNOSIS — H25813 Combined forms of age-related cataract, bilateral: Secondary | ICD-10-CM | POA: Diagnosis not present

## 2023-02-14 LAB — HM DIABETES EYE EXAM

## 2023-02-28 ENCOUNTER — Other Ambulatory Visit: Payer: Medicare Other

## 2023-03-06 ENCOUNTER — Telehealth: Payer: Self-pay | Admitting: Family Medicine

## 2023-03-06 NOTE — Telephone Encounter (Signed)
Copied from CRM [id number]. Topic: Medicare AWV >> Mar 06, 2023 12:27 PM Gwenith Spitz wrote: Reason for CRM: Called patient to schedule Medicare Annual Wellness Visit (AWV). Left message for patient to call back and schedule Medicare Annual Wellness Visit (AWV).  Last date of AWV: N/A  Please schedule an appointment at any time with Inetta Fermo, [facility]. Please schedule AWVI with Inetta Fermo, [facility].  If any questions, please contact me at [phone].  Thank you ,  Gabriel Cirri [facility] AWV TEAM Direct Dial [phone]

## 2023-03-13 DIAGNOSIS — Z111 Encounter for screening for respiratory tuberculosis: Secondary | ICD-10-CM | POA: Diagnosis not present

## 2023-03-15 DIAGNOSIS — Z111 Encounter for screening for respiratory tuberculosis: Secondary | ICD-10-CM | POA: Diagnosis not present

## 2023-04-05 ENCOUNTER — Other Ambulatory Visit: Payer: Self-pay | Admitting: Family Medicine

## 2023-05-17 ENCOUNTER — Other Ambulatory Visit: Payer: Self-pay | Admitting: Family Medicine

## 2023-06-02 ENCOUNTER — Ambulatory Visit
Admission: RE | Admit: 2023-06-02 | Discharge: 2023-06-02 | Disposition: A | Discharge status: Home / Self Care | Payer: Medicare Other | Source: Ambulatory Visit | Attending: Family Medicine | Admitting: Family Medicine

## 2023-06-02 DIAGNOSIS — N950 Postmenopausal bleeding: Secondary | ICD-10-CM | POA: Diagnosis not present

## 2023-06-02 DIAGNOSIS — M8588 Other specified disorders of bone density and structure, other site: Secondary | ICD-10-CM | POA: Diagnosis not present

## 2023-06-02 DIAGNOSIS — M1990 Unspecified osteoarthritis, unspecified site: Secondary | ICD-10-CM | POA: Diagnosis not present

## 2023-06-02 DIAGNOSIS — E2839 Other primary ovarian failure: Secondary | ICD-10-CM | POA: Diagnosis not present

## 2023-06-20 ENCOUNTER — Encounter: Payer: Self-pay | Admitting: Family Medicine

## 2023-06-20 DIAGNOSIS — M85839 Other specified disorders of bone density and structure, unspecified forearm: Secondary | ICD-10-CM | POA: Insufficient documentation

## 2023-06-20 NOTE — Progress Notes (Signed)
See my chart note.

## 2023-06-22 ENCOUNTER — Encounter (INDEPENDENT_AMBULATORY_CARE_PROVIDER_SITE_OTHER): Payer: Self-pay

## 2023-07-03 ENCOUNTER — Encounter: Payer: Medicare Other | Admitting: Family Medicine

## 2023-07-10 ENCOUNTER — Other Ambulatory Visit: Payer: Self-pay | Admitting: Family Medicine

## 2023-07-21 ENCOUNTER — Encounter: Payer: Medicare Other | Admitting: Family Medicine

## 2023-07-28 ENCOUNTER — Encounter: Payer: Self-pay | Admitting: Family Medicine

## 2023-07-28 ENCOUNTER — Ambulatory Visit: Payer: Medicare Other | Admitting: Family Medicine

## 2023-07-28 VITALS — BP 138/70 | HR 69 | Temp 97.6°F | Ht 61.0 in | Wt 166.2 lb

## 2023-07-28 DIAGNOSIS — Z23 Encounter for immunization: Secondary | ICD-10-CM | POA: Diagnosis not present

## 2023-07-28 DIAGNOSIS — E119 Type 2 diabetes mellitus without complications: Secondary | ICD-10-CM

## 2023-07-28 DIAGNOSIS — Z0001 Encounter for general adult medical examination with abnormal findings: Secondary | ICD-10-CM | POA: Diagnosis not present

## 2023-07-28 DIAGNOSIS — E039 Hypothyroidism, unspecified: Secondary | ICD-10-CM | POA: Diagnosis not present

## 2023-07-28 DIAGNOSIS — I152 Hypertension secondary to endocrine disorders: Secondary | ICD-10-CM

## 2023-07-28 DIAGNOSIS — Z7984 Long term (current) use of oral hypoglycemic drugs: Secondary | ICD-10-CM | POA: Diagnosis not present

## 2023-07-28 DIAGNOSIS — E1159 Type 2 diabetes mellitus with other circulatory complications: Secondary | ICD-10-CM | POA: Diagnosis not present

## 2023-07-28 DIAGNOSIS — E782 Mixed hyperlipidemia: Secondary | ICD-10-CM

## 2023-07-28 LAB — CBC WITH DIFFERENTIAL/PLATELET
Basophils Absolute: 0 10*3/uL (ref 0.0–0.1)
Basophils Relative: 0.6 % (ref 0.0–3.0)
Eosinophils Absolute: 0.1 10*3/uL (ref 0.0–0.7)
Eosinophils Relative: 2.4 % (ref 0.0–5.0)
HCT: 35.7 % — ABNORMAL LOW (ref 36.0–46.0)
Hemoglobin: 11.5 g/dL — ABNORMAL LOW (ref 12.0–15.0)
Lymphocytes Relative: 32.4 % (ref 12.0–46.0)
Lymphs Abs: 1.7 10*3/uL (ref 0.7–4.0)
MCHC: 32.4 g/dL (ref 30.0–36.0)
MCV: 91.5 fl (ref 78.0–100.0)
Monocytes Absolute: 0.4 10*3/uL (ref 0.1–1.0)
Monocytes Relative: 8 % (ref 3.0–12.0)
Neutro Abs: 3 10*3/uL (ref 1.4–7.7)
Neutrophils Relative %: 56.6 % (ref 43.0–77.0)
Platelets: 226 10*3/uL (ref 150.0–400.0)
RBC: 3.9 Mil/uL (ref 3.87–5.11)
RDW: 16.8 % — ABNORMAL HIGH (ref 11.5–15.5)
WBC: 5.3 10*3/uL (ref 4.0–10.5)

## 2023-07-28 LAB — COMPREHENSIVE METABOLIC PANEL
ALT: 13 U/L (ref 0–35)
AST: 13 U/L (ref 0–37)
Albumin: 4.1 g/dL (ref 3.5–5.2)
Alkaline Phosphatase: 59 U/L (ref 39–117)
BUN: 15 mg/dL (ref 6–23)
CO2: 27 mEq/L (ref 19–32)
Calcium: 9.2 mg/dL (ref 8.4–10.5)
Chloride: 103 mEq/L (ref 96–112)
Creatinine, Ser: 0.85 mg/dL (ref 0.40–1.20)
GFR: 71.39 mL/min (ref >60.00)
Glucose, Bld: 110 mg/dL — ABNORMAL HIGH (ref 70–99)
Potassium: 4.1 mEq/L (ref 3.5–5.1)
Sodium: 140 mEq/L (ref 135–145)
Total Bilirubin: 0.4 mg/dL (ref 0.2–1.2)
Total Protein: 6.6 g/dL (ref 6.0–8.3)

## 2023-07-28 LAB — LIPID PANEL
Cholesterol: 133 mg/dL (ref 0–200)
HDL: 55 mg/dL (ref >39.00)
LDL Cholesterol: 45 mg/dL (ref 0–99)
NonHDL: 78.05
Total CHOL/HDL Ratio: 2
Triglycerides: 165 mg/dL — ABNORMAL HIGH (ref 0.0–149.0)
VLDL: 33 mg/dL (ref 0.0–40.0)

## 2023-07-28 LAB — MICROALBUMIN / CREATININE URINE RATIO
Creatinine,U: 41.2 mg/dL
Microalb Creat Ratio: 1.7 mg/g (ref 0.0–30.0)
Microalb, Ur: <0.7 mg/dL (ref 0.0–1.9)

## 2023-07-28 LAB — TSH: TSH: 3.78 u[IU]/mL (ref 0.35–5.50)

## 2023-07-28 LAB — HEMOGLOBIN A1C: Hgb A1c MFr Bld: 7.1 % — ABNORMAL HIGH (ref 4.6–6.5)

## 2023-07-28 MED ORDER — METOPROLOL TARTRATE 50 MG PO TABS: 50.0000 mg | ORAL_TABLET | Freq: Two times a day (BID) | ORAL | 3 refills | Status: DC

## 2023-07-28 MED ORDER — LOSARTAN POTASSIUM-HCTZ 100-12.5 MG PO TABS: 1.0000 | ORAL_TABLET | Freq: Every day | ORAL | 3 refills | Status: DC

## 2023-07-28 NOTE — Progress Notes (Signed)
Subjective  Chief Complaint  Patient presents with   Annual Exam    Pt here for Annual Exam and is currently fasting     HPI: Meagan Harrison is a 67 y.o. female who presents to [facility] [place] today for a Female Wellness Visit. She also has the concerns and/or needs as listed above in the chief complaint. These will be addressed in addition to the Health Maintenance Visit.   Wellness Visit: annual visit with health maintenance review and exam without Pap  HM: screens are due. Eligible for tdap booster and flu vaccine today. Minimal osteopenia on dexa: repeat in 3 years.  Chronic disease f/u and/or acute problem visit: (deemed necessary to be done in addition to the wellness visit): Hypertension: Does not check outside of the office.  Mildly elevated in the office today above goal.  On losartan 100 and metoprolol 50 twice daily.  No palpitations.  Has been on this for decades for palpitations.  No adverse effects.  No chest pain or shortness of breath.  No edema Diabetes: Good control.  No symptoms of hyperglycemia.  No foot concerns.  Eye exam up-to-date.  Immunizations current.  Tolerates SGLT2 inhibitor and metformin.  Weight is stable Hypothyroidism on levothyroxine 50 mcg daily is well-controlled.  Good energy, fair sleep.  No symptoms of high or low thyroid Hyperlipidemia on statin rosuvastatin well-controlled.  Fasting for recheck  Assessment  1. Encounter for well adult exam with abnormal findings   2. Controlled type 2 diabetes mellitus without complication, without long-term current use of insulin (HCC)   3. Hypertension associated with diabetes (HCC)   4. Acquired hypothyroidism   5. Mixed hyperlipidemia   6. Diabetes mellitus treated with oral medication (HCC)   7. Need for influenza vaccination      Plan  Female Wellness Visit: Age appropriate Health Maintenance and Prevention measures were discussed with patient. Included topics are cancer screening  recommendations, ways to keep healthy (see AVS) including dietary and exercise recommendations, regular eye and dental care, use of seat belts, and avoidance of moderate alcohol use and tobacco use.  Screens current BMI: discussed patient's BMI and encouraged positive lifestyle modifications to help get to or maintain a target BMI. HM needs and immunizations were addressed and ordered. See below for orders. See HM and immunization section for updates.  Flu shot today.  Deferred Tdap because of Medicare status Routine labs and screening tests ordered including cmp, cbc and lipids where appropriate. Discussed recommendations regarding Vit D and calcium supplementation (see AVS)  Chronic disease management visit and/or acute problem visit: Recheck diabetes, A1c.  Continue metformin 1000 twice daily and Jardiance 25 daily.  Flu shot updated today.  Continue annual eye exams.  Check urine nephropathy screen.  She is on an ARB.  Has been well-controlled Hypertension: Mildly above goal.  Change losartan 100 to losartan/HCTZ 100/12.5 and continue metoprolol 100 twice daily.  Patient will check blood pressures at home and at work.  She will let me know if they run above goal, 130/80.  Check renal function electrolytes Continue Crestor 10 mg nightly check LFTs and lipids today.  Fasting Hypothyroidism on levothyroxine 50 mcg daily.  Clinically euthyroid.  Recheck TSH. Continue allergy medicines  Follow up: 6 months for recheck diabetes Orders Placed This Encounter  Procedures   Flu Vaccine Trivalent High Dose (Fluad)   CBC with Differential/Platelet   Comprehensive metabolic panel   Lipid panel   Hemoglobin A1c   TSH  Microalbumin / creatinine urine ratio   No orders of the defined types were placed in this encounter.     Body mass index is 31.4 kg/m. Wt Readings from Last 3 Encounters:  07/28/23 166 lb 3.2 oz (75.4 kg)  12/30/22 165 lb 3.2 oz (74.9 kg)  09/21/22 165 lb 12.8 oz (75.2 kg)      Patient Active Problem List   Diagnosis Date Noted Date Diagnosed   Mixed hyperlipidemia 01/29/2018     Priority: High   Diabetes mellitus type 2, controlled, without complications (HCC) 01/17/2011     Priority: High   Hypertension associated with diabetes (HCC) 01/17/2011     Priority: High   Acquired hypothyroidism 01/17/2011     Priority: High   Osteopenia of forearm 06/20/2023     Priority: Medium     DEXA 05/2023: lowest T = - 1.5 at forearm, all other sites WNL. Recheck 3-5 years.    Obesity (BMI 30-39.9) 07/15/2020     Priority: Medium    Lower leg edema 07/15/2020     Priority: Medium    Primary osteoarthritis of left knee 03/25/2016     Priority: Medium    Osteoarthritis, hand 10/18/2011     Priority: Medium     Overview:  righ 1st MCP joint    GERD (gastroesophageal reflux disease) 01/17/2011     Priority: Medium    Allergic rhinitis 01/12/2010     Priority: Low    Overview:  10/1 IMO update    Tricuspid regurgitation 01/12/2010     Priority: Low    Overview:  Mild Tricuspid Regurgitation - echo at [facility]; no further eval needed    Health Maintenance  Topic Date Due   Medicare Annual Wellness (AWV)  Never done   Diabetic kidney evaluation - eGFR measurement  06/25/2023   Diabetic kidney evaluation - Urine ACR  06/25/2023   HEMOGLOBIN A1C  06/30/2023   COVID-19 Vaccine (5 - 2023-24 season) 08/13/2023 (Originally 07/09/2023)   MAMMOGRAM  11/16/2023   OPHTHALMOLOGY EXAM  02/14/2024   FOOT EXAM  07/27/2024   Fecal DNA (Cologuard)  04/22/2025   DEXA SCAN  06/01/2028   Pneumonia Vaccine 57+ Years old  Completed   INFLUENZA VACCINE  Completed   Hepatitis C Screening  Completed   Zoster Vaccines- Shingrix  Completed   HPV VACCINES  Aged Out   DTaP/Tdap/Td  Discontinued   Colonoscopy  Discontinued   Immunization History  Administered Date(s) Administered   Fluad Trivalent(High Dose 65+) 07/28/2023   Hepatitis B, PED/ADOLESCENT 02/28/1991   Influenza  Inj Mdck Quad With Preservative 08/25/2017   Influenza Split 08/07/2000, 08/25/2011, 08/23/2015   Influenza, Seasonal, Injecte, Preservative Fre 08/26/2014, 08/22/2015, 09/03/2016   Influenza,inj,Quad PF,6+ Mos 08/25/2017, 08/01/2018, 08/08/2019, 07/15/2020, 08/06/2022   Influenza-Unspecified 08/07/2021   PFIZER(Purple Top)SARS-COV-2 Vaccination 01/08/2020, 01/29/2020, 07/31/2020   PNEUMOCOCCAL CONJUGATE-20 06/24/2022   PPD Test 11/08/2007   Pfizer Covid-19 Vaccine Bivalent Booster 61yrs & up 10/13/2021   Pneumococcal Conjugate-13 03/04/2014   Pneumococcal Polysaccharide-23 01/12/2010   Td 07/08/2005   Tdap 01/19/2012   Zoster Recombinant(Shingrix) 11/14/2018, 05/07/2019   We updated and reviewed the patient's past history in detail and it is documented below. Allergies: Patient is allergic to lisinopril. Past Medical History Patient  has a past medical history of Arthritis, Diabetes mellitus (HCC), GERD (gastroesophageal reflux disease), Hyperlipidemia, Hypertension, Thyroid disease, and Tuberculosis. Past Surgical History Patient  has a past surgical history that includes Cesarean section. Family History: Patient family history includes Arthritis in  her maternal grandmother and mother; Breast cancer in her maternal aunt; Diabetes in her father; Healthy in her daughter and daughter; Hypertension in her father; Rectal cancer in her mother; Stroke in her father and maternal grandfather. Social History:  Patient  reports that she has never smoked. She has never used smokeless tobacco. She reports current alcohol use. She reports that she does not use drugs.  Review of Systems: Constitutional: negative for fever or malaise Ophthalmic: negative for photophobia, double vision or loss of vision Cardiovascular: negative for chest pain, dyspnea on exertion, or new LE swelling Respiratory: negative for SOB or persistent cough Gastrointestinal: negative for abdominal pain, change in bowel  habits or melena Genitourinary: negative for dysuria or gross hematuria, no abnormal uterine bleeding or disharge Musculoskeletal: negative for new gait disturbance or muscular weakness Integumentary: negative for new or persistent rashes, no breast lumps Neurological: negative for TIA or stroke symptoms Psychiatric: negative for SI or delusions Allergic/Immunologic: negative for hives  Patient Care Team    Relationship Specialty Notifications Start End  Willow Ora, MD PCP - General Family Medicine  01/29/18   Sallye Lat, MD Consulting Physician Ophthalmology  01/29/18   Marshell Levan    01/29/18    Comment: Dentist  Bernette Redbird, MD Consulting Physician Gastroenterology  03/25/20     Objective  Vitals: BP 138/70   Pulse 69   Temp 97.6 F (36.4 C)   Ht 5\' 1"  (1.549 m)   Wt 166 lb 3.2 oz (75.4 kg)   SpO2 96%   BMI 31.40 kg/m  General:  Well developed, well nourished, no acute distress  Psych:  Alert and orientedx3,normal mood and affect HEENT:  Normocephalic, atraumatic, non-icteric sclera,  supple neck without adenopathy, mass or thyromegaly Cardiovascular:  Normal S1, S2, RRR without gallop, rub or murmur Respiratory:  Good breath sounds bilaterally, CTAB with normal respiratory effort Gastrointestinal: normal bowel sounds, soft, non-tender, no noted masses. No HSM MSK: extremities without edema, joints without erythema or swelling Neurologic:    Mental status is normal.  Gross motor and sensory exams are normal.  No tremor Diabetic Foot Exam: Appearance - no lesions, ulcers or significant calluses Skin - no sigificant pallor or erythema Normal sensation Pulses - +2 distally bilaterally   Lab Results  Component Value Date   HGBA1C 6.5 (A) 12/30/2022   HGBA1C 6.3 (A) 09/21/2022   HGBA1C 7.4 (H) 06/24/2022      Commons side effects, risks, benefits, and alternatives for medications and treatment plan prescribed today were discussed, and the patient  expressed understanding of the given instructions. Patient is instructed to call or message via MyChart if he/she has any questions or concerns regarding our treatment plan. No barriers to understanding were identified. We discussed Red Flag symptoms and signs in detail. Patient expressed understanding regarding what to do in case of urgent or emergency type symptoms.  Medication list was reconciled, printed and provided to the patient in AVS. Patient instructions and summary information was reviewed with the patient as documented in the AVS. This note was prepared with assistance of Dragon voice recognition software. Occasional wrong-word or sound-a-like substitutions may have occurred due to the inherent limitations of voice recognition software

## 2023-08-03 ENCOUNTER — Encounter: Payer: Self-pay | Admitting: Family Medicine

## 2023-08-03 DIAGNOSIS — D649 Anemia, unspecified: Secondary | ICD-10-CM | POA: Insufficient documentation

## 2023-08-03 HISTORY — DX: Anemia, unspecified: D64.9

## 2023-08-03 NOTE — Progress Notes (Signed)
See mychart note Dear Ms. Andrey Campanile, I have reviewed your lab results.  Your diabetic control has worsened.  Your A1c is at 7.1.  As you know, we always want this less than 7.  Please work on improving your diet.  I will recheck at your next visit and adjust medications at that time if needed. Your other lab results look stable.  There is a mild new anemia and I will recheck this at your next visit. Let me know if you have any questions Sincerely, Dr. Mardelle Matte

## 2023-08-11 DIAGNOSIS — H524 Presbyopia: Secondary | ICD-10-CM | POA: Diagnosis not present

## 2023-10-24 DIAGNOSIS — H524 Presbyopia: Secondary | ICD-10-CM | POA: Diagnosis not present

## 2024-01-29 ENCOUNTER — Encounter: Payer: Self-pay | Admitting: Family Medicine

## 2024-01-29 ENCOUNTER — Ambulatory Visit (INDEPENDENT_AMBULATORY_CARE_PROVIDER_SITE_OTHER): Payer: Medicare Other | Admitting: Family Medicine

## 2024-01-29 VITALS — BP 128/61 | HR 83 | Temp 98.4°F | Ht 61.0 in | Wt 157.6 lb

## 2024-01-29 DIAGNOSIS — I152 Hypertension secondary to endocrine disorders: Secondary | ICD-10-CM

## 2024-01-29 DIAGNOSIS — Z111 Encounter for screening for respiratory tuberculosis: Secondary | ICD-10-CM | POA: Diagnosis not present

## 2024-01-29 DIAGNOSIS — D649 Anemia, unspecified: Secondary | ICD-10-CM | POA: Diagnosis not present

## 2024-01-29 DIAGNOSIS — E1159 Type 2 diabetes mellitus with other circulatory complications: Secondary | ICD-10-CM

## 2024-01-29 DIAGNOSIS — Z7984 Long term (current) use of oral hypoglycemic drugs: Secondary | ICD-10-CM

## 2024-01-29 DIAGNOSIS — E119 Type 2 diabetes mellitus without complications: Secondary | ICD-10-CM

## 2024-01-29 LAB — CBC WITH DIFFERENTIAL/PLATELET
Basophils Absolute: 0 10*3/uL (ref 0.0–0.1)
Basophils Relative: 0.7 % (ref 0.0–3.0)
Eosinophils Absolute: 0.1 10*3/uL (ref 0.0–0.7)
Eosinophils Relative: 2 % (ref 0.0–5.0)
HCT: 36.6 % (ref 36.0–46.0)
Hemoglobin: 12.4 g/dL (ref 12.0–15.0)
Lymphocytes Relative: 23.8 % (ref 12.0–46.0)
Lymphs Abs: 1.7 10*3/uL (ref 0.7–4.0)
MCHC: 33.9 g/dL (ref 30.0–36.0)
MCV: 99.4 fl (ref 78.0–100.0)
Monocytes Absolute: 0.5 10*3/uL (ref 0.1–1.0)
Monocytes Relative: 7.1 % (ref 3.0–12.0)
Neutro Abs: 4.8 10*3/uL (ref 1.4–7.7)
Neutrophils Relative %: 66.4 % (ref 43.0–77.0)
Platelets: 223 10*3/uL (ref 150.0–400.0)
RBC: 3.68 Mil/uL — ABNORMAL LOW (ref 3.87–5.11)
RDW: 14.1 % (ref 11.5–15.5)
WBC: 7.2 10*3/uL (ref 4.0–10.5)

## 2024-01-29 LAB — POCT GLYCOSYLATED HEMOGLOBIN (HGB A1C): Hemoglobin A1C: 6.1 % — AB (ref 4.0–5.6)

## 2024-01-29 NOTE — Patient Instructions (Signed)
 Please return in 6 months for your annual complete physical; please come fasting.    I will release your lab results to you on your MyChart account with further instructions. You may see the results before I do, but when I review them I will send you a message with my report or have my assistant call you if things need to be discussed. Please reply to my message with any questions. Thank you!   If you have any questions or concerns, please don't hesitate to send me a message via MyChart or call the office at [phone]. Thank you for visiting with Meagan Harrison today! It's our pleasure caring for you.   VISIT SUMMARY:  Today, we reviewed your recent weight loss, blood pressure readings, and overall health. We discussed your increased physical activity and its positive impact on your diabetes management. We also addressed your mild anemia and the need for routine lab tests and vaccinations.  YOUR PLAN:  -TYPE 2 DIABETES MELLITUS: Type 2 Diabetes Mellitus is a condition where the body does not use insulin properly, leading to high blood sugar levels. Your diabetes is well-controlled with an A1c of 6.1, likely due to your weight loss and increased physical activity. Continue with your current diet and exercise routine, and we will monitor your A1c levels regularly.  -HYPERTENSION: Hypertension is high blood pressure. Your blood pressure is well-controlled at 120/80 mmHg, but you have reported occasional low readings, possibly due to your weight loss and increased activity. Continue to monitor your blood pressure regularly, and we may consider adjusting your medication if the low readings persist.  -ANEMIA: Anemia is a condition where you do not have enough healthy red blood cells to carry adequate oxygen to your body's tissues. You had mild anemia noted in September, and we will recheck your hemoglobin, B12, folate, and iron levels to determine the cause.  -GENERAL HEALTH MAINTENANCE: For general health  maintenance, you do not need a measles vaccine booster at this time. We will perform your annual PPD test today, which is required for your work.  INSTRUCTIONS:  Please follow up with the lab tests for hemoglobin, B12, folate, and iron levels. Continue monitoring your blood pressure and A1c levels regularly. If you experience any symptoms or have concerns, please schedule an appointment.

## 2024-01-29 NOTE — Progress Notes (Signed)
 Subjective  CC:  Chief Complaint  Patient presents with   Hypertension   Diabetes   Hypothyroidism    HPI: Meagan Harrison is a 68 y.o. female who presents to the office today for follow up of diabetes, hypertension and problems listed above in the chief complaint.  Diabetic f/u: Her diabetic control is reported as Improved. Eating better and exercising. Has lost weight.  She denies exertional CP or SOB or symptomatic hypoglycemia. She denies foot sores.   Hypertension f/u: Control is good . Pt reports she is doing well. taking medications as instructed, no medication side effects noted, no TIAs, no chest pain on exertion, no dyspnea on exertion, no swelling of ankles. Occassional low reading w/o sxs since weight loss.  She denies adverse effects from his BP medications. Compliance with medication is good.   Hyperlipidemia f/u: Patient presents for follow up of lipids. Lipids have been well controlled on meds w/o myalgias or side effects. Compliance with treatment thus far has been excellent.  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  Normocytic anemia on labs in September due f/u. No sxs  Assessment  1. Controlled type 2 diabetes mellitus without complication, without long-term current use of insulin (HCC)   2. Normocytic anemia   3. Hypertension associated with diabetes (HCC)   4. Screening-pulmonary TB      Plan  Diabetes is currently very well controlled.  Hypertension is currently very well controlled. May need to lower dose of medications if loses more weight. Pt will monitor.  Hyperlipidemia f/u:  at goal. On statin Check hgb, b12 folate and iron Screen for tb with quantiferon TB gold.  Diabetic education: ongoing education regarding chronic disease management for diabetes was given today. We continue to reinforce the ABC's of diabetic management: A1c (<7 or 8 dependent upon patient), tight blood pressure control, and cholesterol management with goal LDL < 100 minimally. We  discuss diet strategies, exercise recommendations, medication options and possible side effects. At each visit, we review recommended immunizations and preventive care recommendations for diabetics and stress that good diabetic control can prevent other problems. See below for this patient's data. Hypertension education: ongoing education regarding management of these chronic disease states was given. Management strategies discussed on successive visits include dietary and exercise recommendations, goals of achieving and maintaining IBW, and lifestyle modifications aiming for adequate sleep and minimizing stressors.   Follow up: 6 mo for cpe.  Orders Placed This Encounter  Procedures   CBC with Differential/Platelet   Iron, TIBC and Ferritin Panel   B12 and Folate Panel   QuantiFERON-TB Gold Plus   POCT HgB A1C   No orders of the defined types were placed in this encounter.     I reviewed the patients updated PMH, FH, and SocHx.  Patient Active Problem List   Diagnosis Date Noted   Mixed hyperlipidemia 01/29/2018    Priority: High   Diabetes mellitus type 2, controlled, without complications (HCC) 01/17/2011    Priority: High   Hypertension associated with diabetes (HCC) 01/17/2011    Priority: High   Acquired hypothyroidism 01/17/2011    Priority: High   Normocytic anemia 08/03/2023    Priority: Medium    Osteopenia of forearm 06/20/2023    Priority: Medium    Obesity (BMI 30-39.9) 07/15/2020    Priority: Medium    Lower leg edema 07/15/2020    Priority: Medium    Primary osteoarthritis of left knee 03/25/2016    Priority: Medium    Osteoarthritis,  hand 10/18/2011    Priority: Medium    GERD (gastroesophageal reflux disease) 01/17/2011    Priority: Medium    Allergic rhinitis 01/12/2010    Priority: Low   Tricuspid regurgitation 01/12/2010    Priority: Low   Immunization History  Administered Date(s) Administered   Fluad Trivalent(High Dose 65+) 07/28/2023    Hepatitis B, ADULT 02/28/1991   Hepatitis B, PED/ADOLESCENT 02/28/1991   Influenza Inj Mdck Quad With Preservative 08/25/2017   Influenza Split 08/07/2000, 08/25/2011, 08/23/2015   Influenza, Seasonal, Injecte, Preservative Fre 08/26/2014, 08/22/2015, 09/03/2016   Influenza,inj,Quad PF,6+ Mos 08/25/2017, 08/01/2018, 08/08/2019, 07/15/2020, 08/06/2022   Influenza-Unspecified 08/07/2021   PFIZER(Purple Top)SARS-COV-2 Vaccination 01/08/2020, 01/29/2020, 07/31/2020   PNEUMOCOCCAL CONJUGATE-20 06/24/2022   PPD Test 11/08/2007, 02/28/2022, 03/13/2023   Pfizer Covid-19 Vaccine Bivalent Booster 16yrs & up 10/13/2021   Pneumococcal Conjugate-13 03/04/2014   Pneumococcal Polysaccharide-23 01/12/2010   Td 07/08/2005   Tdap 01/19/2012   Zoster Recombinant(Shingrix) 11/14/2018, 05/07/2019   Health Maintenance  Topic Date Due   Medicare Annual Wellness (AWV)  Never done   MAMMOGRAM  11/16/2023   COVID-19 Vaccine (5 - 2024-25 season) 02/14/2024 (Originally 07/09/2023)   OPHTHALMOLOGY EXAM  02/14/2024   Diabetic kidney evaluation - eGFR measurement  07/27/2024   Diabetic kidney evaluation - Urine ACR  07/27/2024   FOOT EXAM  07/27/2024   HEMOGLOBIN A1C  07/31/2024   Fecal DNA (Cologuard)  04/22/2025   DEXA SCAN  06/01/2028   Pneumonia Vaccine 36+ Years old  Completed   INFLUENZA VACCINE  Completed   Hepatitis C Screening  Completed   Zoster Vaccines- Shingrix  Completed   HPV VACCINES  Aged Out   DTaP/Tdap/Td  Discontinued   Colonoscopy  Discontinued   Diabetes and HTN Related Lab Review: Lab Results  Component Value Date   HGBA1C 6.1 (A) 01/29/2024   HGBA1C 7.1 (H) 07/28/2023   HGBA1C 6.5 (A) 12/30/2022    Lab Results  Component Value Date   MICROALBUR <0.7 07/28/2023   Lab Results  Component Value Date   CREATININE 0.85 07/28/2023   BUN 15 07/28/2023   NA 140 07/28/2023   K 4.1 07/28/2023   CL 103 07/28/2023   CO2 27 07/28/2023   Lab Results  Component Value Date   CHOL 133  07/28/2023   CHOL 154 06/24/2022   CHOL 142 05/18/2021   Lab Results  Component Value Date   HDL 55.00 07/28/2023   HDL 51.90 06/24/2022   HDL 48.50 05/18/2021   Lab Results  Component Value Date   LDLCALC 45 07/28/2023   LDLCALC 72 06/24/2022   LDLCALC 57 05/18/2021   Lab Results  Component Value Date   TRIG 165.0 (H) 07/28/2023   TRIG 149.0 06/24/2022   TRIG 181.0 (H) 05/18/2021   Lab Results  Component Value Date   CHOLHDL 2 07/28/2023   CHOLHDL 3 06/24/2022   CHOLHDL 3 05/18/2021   No results found for: "LDLDIRECT" The 10-year ASCVD risk score (Arnett DK, et al., 2019) is: 12.8%   Values used to calculate the score:     Age: 63 years     Sex: Female     Is Non-Hispanic African American: No     Diabetic: Yes     Tobacco smoker: No     Systolic Blood Pressure: 128 mmHg     Is BP treated: Yes     HDL Cholesterol: 55 mg/dL     Total Cholesterol: 133 mg/dL  BP Readings from Last 3 Encounters:  01/29/24  128/61  07/28/23 138/70  12/30/22 128/64   Wt Readings from Last 3 Encounters:  01/29/24 157 lb 9.6 oz (71.5 kg)  07/28/23 166 lb 3.2 oz (75.4 kg)  12/30/22 165 lb 3.2 oz (74.9 kg)    Allergies: Patient is allergic to lisinopril. Family History: Patient family history includes Arthritis in her maternal grandmother and mother; Breast cancer in her maternal aunt; Diabetes in her father; Healthy in her daughter and daughter; Hypertension in her father; Rectal cancer in her mother; Stroke in her father and maternal grandfather. Social History:  Patient  reports that she has never smoked. She has never used smokeless tobacco. She reports current alcohol use. She reports that she does not use drugs.  Review of Systems: Ophthalmic: negative for eye pain, loss of vision or double vision Cardiovascular: negative for chest pain Respiratory: negative for SOB or persistent cough Gastrointestinal: negative for abdominal pain Genitourinary: negative for dysuria or gross  hematuria MSK: negative for foot lesions Neurologic: negative for weakness or gait disturbance Current Meds  Medication Sig   Calcium Citrate-Vitamin D 200-250 MG-UNIT TABS Take by mouth.   cetirizine (ZYRTEC) 10 MG tablet Take 1 tablet (10 mg total) by mouth daily as needed for allergies.   diclofenac sodium (VOLTAREN) 1 % GEL APPLY 1 GRAMS TO affected joints  FOUR TIMES DAILY AS DIRECTED   empagliflozin (JARDIANCE) 25 MG TABS tablet Take 1 tablet (25 mg total) by mouth daily before breakfast.   famotidine (PEPCID) 20 MG tablet Take 1 tablet (20 mg total) by mouth 2 (two) times daily.   fluticasone (FLONASE) 50 MCG/ACT nasal spray INSTILL 1 TO 2 SPRAYS IN EACH NOSTRIL ONCE TO TWICE DAILY   glucosamine-chondroitin 500-400 MG tablet Take 1 tablet by mouth daily. Take 1 tablet by mouth daily   levothyroxine (SYNTHROID) 50 MCG tablet TAKE 1 TABLET BY MOUTH DAILY   losartan-hydrochlorothiazide (HYZAAR) 100-12.5 MG tablet Take 1 tablet by mouth daily.   meloxicam (MOBIC) 7.5 MG tablet TAKE 1 TABLET BY MOUTH DAILY AS NEEDED FOR PAIN   metFORMIN (GLUCOPHAGE) 1000 MG tablet TAKE 1 TABLET BY MOUTH TWICE A DAY WITH A MEAL   metoprolol tartrate (LOPRESSOR) 50 MG tablet Take 1 tablet (50 mg total) by mouth 2 (two) times daily.   montelukast (SINGULAIR) 10 MG tablet TAKE ONE TABLET BY MOUTH AT BEDTIME   Multiple Vitamin (MULTIVITAMIN) tablet Take by mouth.   rosuvastatin (CRESTOR) 10 MG tablet TAKE ONE TABLET BY MOUTH AT BEDTIME    Objective  Vitals: BP 128/61   Pulse 83   Temp 98.4 F (36.9 C)   Ht 5\' 1"  (1.549 m)   Wt 157 lb 9.6 oz (71.5 kg)   SpO2 97%   BMI 29.78 kg/m  General: well appearing, no acute distress  Psych:  Alert and oriented, normal mood and affect HEENT:  Normocephalic, atraumatic, moist mucous membranes, supple neck  Cardiovascular:  Nl S1 and S2, RRR without murmur, gallop or rub. no edema Respiratory:  Good breath sounds bilaterally, CTAB with normal effort, no  rales   Commons side effects, risks, benefits, and alternatives for medications and treatment plan prescribed today were discussed, and the patient expressed understanding of the given instructions. Patient is instructed to call or message via MyChart if he/she has any questions or concerns regarding our treatment plan. No barriers to understanding were identified. We discussed Red Flag symptoms and signs in detail. Patient expressed understanding regarding what to do in case of urgent or emergency type symptoms.  Medication list was reconciled, printed and provided to the patient in AVS. Patient instructions and summary information was reviewed with the patient as documented in the AVS. This note was prepared with assistance of Dragon voice recognition software. Occasional wrong-word or sound-a-like substitutions may have occurred due to the inherent limitations of voice recognition software

## 2024-01-30 LAB — B12 AND FOLATE PANEL
Folate: >25.2 ng/mL (ref >5.9)
Vitamin B-12: 209 pg/mL — ABNORMAL LOW (ref 211–911)

## 2024-01-31 LAB — IRON,TIBC AND FERRITIN PANEL
%SAT: 23 % (ref 16–45)
Ferritin: 11 ng/mL — ABNORMAL LOW (ref 16–288)
Iron: 82 ug/dL (ref 45–160)
TIBC: 352 ug/dL (ref 250–450)

## 2024-01-31 LAB — QUANTIFERON-TB GOLD PLUS
Mitogen-NIL: 6 [IU]/mL
NIL: 0.03 [IU]/mL
QuantiFERON-TB Gold Plus: NEGATIVE
TB1-NIL: 0 [IU]/mL
TB2-NIL: <0 [IU]/mL

## 2024-02-05 ENCOUNTER — Encounter: Payer: Self-pay | Admitting: Family Medicine

## 2024-02-05 DIAGNOSIS — E538 Deficiency of other specified B group vitamins: Secondary | ICD-10-CM | POA: Insufficient documentation

## 2024-02-05 NOTE — Progress Notes (Signed)
 See mychart note Dear Ms. Kaczorowski, Your TB screen is negative and your anemia is corrected; however your Vitamin B12 level is low: please start otc vitamin B12 daily and add iron rich foods.  Take care . Sincerely, Dr. Mardelle Matte

## 2024-02-26 DIAGNOSIS — H25813 Combined forms of age-related cataract, bilateral: Secondary | ICD-10-CM | POA: Diagnosis not present

## 2024-02-26 DIAGNOSIS — E119 Type 2 diabetes mellitus without complications: Secondary | ICD-10-CM | POA: Diagnosis not present

## 2024-02-26 LAB — HM DIABETES EYE EXAM

## 2024-04-16 ENCOUNTER — Other Ambulatory Visit: Payer: Self-pay | Admitting: Family Medicine

## 2024-05-04 ENCOUNTER — Other Ambulatory Visit: Payer: Self-pay | Admitting: Family Medicine

## 2024-08-02 ENCOUNTER — Other Ambulatory Visit: Payer: Self-pay

## 2024-08-02 ENCOUNTER — Encounter: Payer: Self-pay | Admitting: Family Medicine

## 2024-08-02 ENCOUNTER — Ambulatory Visit: Admitting: Family Medicine

## 2024-08-02 VITALS — BP 128/60 | HR 70 | Temp 97.7°F | Ht 61.0 in | Wt 159.8 lb

## 2024-08-02 DIAGNOSIS — E1159 Type 2 diabetes mellitus with other circulatory complications: Secondary | ICD-10-CM | POA: Diagnosis not present

## 2024-08-02 DIAGNOSIS — E1142 Type 2 diabetes mellitus with diabetic polyneuropathy: Secondary | ICD-10-CM

## 2024-08-02 DIAGNOSIS — Z7984 Long term (current) use of oral hypoglycemic drugs: Secondary | ICD-10-CM

## 2024-08-02 DIAGNOSIS — D649 Anemia, unspecified: Secondary | ICD-10-CM

## 2024-08-02 DIAGNOSIS — Z0001 Encounter for general adult medical examination with abnormal findings: Secondary | ICD-10-CM

## 2024-08-02 DIAGNOSIS — M1712 Unilateral primary osteoarthritis, left knee: Secondary | ICD-10-CM

## 2024-08-02 DIAGNOSIS — Z0000 Encounter for general adult medical examination without abnormal findings: Secondary | ICD-10-CM | POA: Diagnosis not present

## 2024-08-02 DIAGNOSIS — I152 Hypertension secondary to endocrine disorders: Secondary | ICD-10-CM

## 2024-08-02 DIAGNOSIS — E538 Deficiency of other specified B group vitamins: Secondary | ICD-10-CM | POA: Diagnosis not present

## 2024-08-02 DIAGNOSIS — M23207 Derangement of unspecified meniscus due to old tear or injury, left knee: Secondary | ICD-10-CM | POA: Diagnosis not present

## 2024-08-02 DIAGNOSIS — E039 Hypothyroidism, unspecified: Secondary | ICD-10-CM

## 2024-08-02 DIAGNOSIS — Z23 Encounter for immunization: Secondary | ICD-10-CM | POA: Diagnosis not present

## 2024-08-02 DIAGNOSIS — E782 Mixed hyperlipidemia: Secondary | ICD-10-CM | POA: Diagnosis not present

## 2024-08-02 DIAGNOSIS — E119 Type 2 diabetes mellitus without complications: Secondary | ICD-10-CM

## 2024-08-02 DIAGNOSIS — K219 Gastro-esophageal reflux disease without esophagitis: Secondary | ICD-10-CM

## 2024-08-02 LAB — COMPREHENSIVE METABOLIC PANEL WITH GFR
ALT: 16 U/L (ref 0–35)
AST: 17 U/L (ref 0–37)
Albumin: 4.2 g/dL (ref 3.5–5.2)
Alkaline Phosphatase: 73 U/L (ref 39–117)
BUN: 19 mg/dL (ref 6–23)
CO2: 30 meq/L (ref 19–32)
Calcium: 9.7 mg/dL (ref 8.4–10.5)
Chloride: 98 meq/L (ref 96–112)
Creatinine, Ser: 0.88 mg/dL (ref 0.40–1.20)
GFR: 67.99 mL/min (ref >60.00)
Glucose, Bld: 116 mg/dL — ABNORMAL HIGH (ref 70–99)
Potassium: 4.1 meq/L (ref 3.5–5.1)
Sodium: 140 meq/L (ref 135–145)
Total Bilirubin: 0.5 mg/dL (ref 0.2–1.2)
Total Protein: 6.7 g/dL (ref 6.0–8.3)

## 2024-08-02 LAB — CBC WITH DIFFERENTIAL/PLATELET
Basophils Absolute: 0.1 K/uL (ref 0.0–0.1)
Basophils Relative: 0.8 % (ref 0.0–3.0)
Eosinophils Absolute: 0.2 K/uL (ref 0.0–0.7)
Eosinophils Relative: 3.4 % (ref 0.0–5.0)
HCT: 38.2 % (ref 36.0–46.0)
Hemoglobin: 12.9 g/dL (ref 12.0–15.0)
Lymphocytes Relative: 24.4 % (ref 12.0–46.0)
Lymphs Abs: 1.7 K/uL (ref 0.7–4.0)
MCHC: 33.8 g/dL (ref 30.0–36.0)
MCV: 97 fl (ref 78.0–100.0)
Monocytes Absolute: 0.6 K/uL (ref 0.1–1.0)
Monocytes Relative: 8.8 % (ref 3.0–12.0)
Neutro Abs: 4.4 K/uL (ref 1.4–7.7)
Neutrophils Relative %: 62.6 % (ref 43.0–77.0)
Platelets: 254 K/uL (ref 150.0–400.0)
RBC: 3.94 Mil/uL (ref 3.87–5.11)
RDW: 15 % (ref 11.5–15.5)
WBC: 7 K/uL (ref 4.0–10.5)

## 2024-08-02 LAB — MICROALBUMIN / CREATININE URINE RATIO
Creatinine,U: 54.6 mg/dL
Microalb Creat Ratio: UNDETERMINED mg/g (ref 0.0–30.0)
Microalb, Ur: <0.7 mg/dL

## 2024-08-02 LAB — VITAMIN B12: Vitamin B-12: 238 pg/mL (ref 211–911)

## 2024-08-02 LAB — LIPID PANEL
Cholesterol: 142 mg/dL (ref 0–200)
HDL: 56.7 mg/dL (ref >39.00)
LDL Cholesterol: 60 mg/dL (ref 0–99)
NonHDL: 84.94
Total CHOL/HDL Ratio: 2
Triglycerides: 126 mg/dL (ref 0.0–149.0)
VLDL: 25.2 mg/dL (ref 0.0–40.0)

## 2024-08-02 LAB — TSH: TSH: 1.92 u[IU]/mL (ref 0.35–5.50)

## 2024-08-02 LAB — HEMOGLOBIN A1C: Hgb A1c MFr Bld: 7.2 % — ABNORMAL HIGH (ref 4.6–6.5)

## 2024-08-02 LAB — VITAMIN D 25 HYDROXY (VIT D DEFICIENCY, FRACTURES): VITD: 69.36 ng/mL (ref 30.00–100.00)

## 2024-08-02 MED ORDER — METOPROLOL TARTRATE 50 MG PO TABS: 50.0000 mg | ORAL_TABLET | Freq: Two times a day (BID) | ORAL | 3 refills | Status: AC

## 2024-08-02 MED ORDER — LOSARTAN POTASSIUM-HCTZ 100-12.5 MG PO TABS: 1.0000 | ORAL_TABLET | Freq: Every day | ORAL | 3 refills | Status: AC

## 2024-08-02 MED ORDER — EMPAGLIFLOZIN 25 MG PO TABS: 25.0000 mg | ORAL_TABLET | Freq: Every day | ORAL | 3 refills | Status: AC

## 2024-08-02 NOTE — Progress Notes (Signed)
 Subjective  Chief Complaint  Patient presents with   Annual Exam    Pt here for annual exam and is currently fasting. Mammo has not been scheduled    Diabetes    HPI: Meagan Harrison is a 68 y.o. female who presents to [facility] [place] today for a Female Wellness Visit. She also has the concerns and/or needs as listed above in the chief complaint. These will be addressed in addition to the Health Maintenance Visit.   Wellness Visit: annual visit with health maintenance review and exam  HM: overdue for mammo since January: had a sick dog who passed in July; now able to take time to get it done. She will schedule. Other screens current. Flu shot today. Doing well overall; just returned from [place] Chronic disease f/u and/or acute problem visit: (deemed necessary to be done in addition to the wellness visit): Discussed the use of AI scribe software for clinical note transcription with the patient, who gave verbal consent to proceed.  History of Present Illness Meagan Harrison is a 68 year old female who presents for a physical exam and follow-up of her medical problems.  Diabetes - Diabetes is generally well-managed.  Continues on Farxiga  and metformin .  Tolerates well. - No symptoms of hyperglycemia, including excessive thirst, blurred vision, or increased urination - Fasting for today's visit no foot concerns, no retinopathy.  She does admit to some numbness in her feet periodically.  No pain.  Hypertension and hyperlipidemia - Hypertension and hyperlipidemia are generally well-managed takes her medications.  Fasting for recheck.  No concerns.  No shortness of breath or chest pain.  No lower extremity edema.  Left knee instability and pain, has known osteoarthritis and history of meniscal tear.  Has seen orthopedics in the past. - Difficulty walking, especially on uneven surfaces such as stairs or hills - Sensation of instability with fear the knee might 'give  away' - History of left knee injury with meniscal tear a few years ago, treated with physical therapy and brace - No current swelling, warmth, or constant pain - Knee symptoms limit activity  Peripheral neuropathy symptoms - Occasional numbness and tingling in extremities - Taking vitamin B12 supplements - Plan to check vitamin B12 levels  Hypothyroidism: On medicines.  Feels energy is good.  No symptoms of high or low thyroid .  GERD is controlled  B12 deficiency with history of normocytic anemia: She reports she is taking oral vitamin B12 supplements.  Preventive health maintenance - Mammogram overdue since January, not yet completed due to personal circumstances  General health and recent events - No chest pain, breathing difficulties, or abdominal issues - Received influenza vaccination today - Recently returned from vacation to [facility]   Assessment  1. Encounter for well adult exam with abnormal findings   2. Need for influenza vaccination   3. Diabetes mellitus treated with oral medication (HCC)   4. Controlled type 2 diabetes mellitus without complication, without long-term current use of insulin (HCC)   5. Hypertension associated with diabetes (HCC)   6. Mixed hyperlipidemia   7. Acquired hypothyroidism   8. Gastroesophageal reflux disease, unspecified whether esophagitis present   9. Vitamin B12 deficiency   10. Other old tear of meniscus of left knee, unspecified meniscus   11. Primary osteoarthritis of left knee   12. Type 2 diabetes mellitus with peripheral neuropathy (HCC)   13. Normocytic anemia      Plan  Female Wellness Visit: Age appropriate Health Maintenance  and Prevention measures were discussed with patient. Included topics are cancer screening recommendations, ways to keep healthy (see AVS) including dietary and exercise recommendations, regular eye and dental care, use of seat belts, and avoidance of moderate alcohol use and tobacco use.  Patient  to schedule mammogram BMI: discussed patient's BMI and encouraged positive lifestyle modifications to help get to or maintain a target BMI. HM needs and immunizations were addressed and ordered. See below for orders. See HM and immunization section for updates.  Flu shot updated today Routine labs and screening tests ordered including cmp, cbc and lipids where appropriate. Discussed recommendations regarding Vit D and calcium  supplementation (see AVS)  Chronic disease management visit and/or acute problem visit: Assessment and Plan Assessment & Plan Left knee pain with meniscus tear and osteoarthritis Intermittent pain during specific activities, likely due to meniscus fray and arthritis. - Recommend quadriceps strengthening exercises, including straight leg raises. - Advise wearing a knee brace during activities that stress the knee. - Suggest icing the knee if it becomes bothersome. - Consider physical therapy if symptoms worsen.  Type 2 diabetes mellitus Check A1c and renal function today.  Check urine nephropathy screen.  Footcare discussed  Hypertension is controlled  Mixed hyperlipidemia on statin due for recheck.  Tolerates well  Hypothyroidism clinically euthyroid.  Recheck TSH today.  Vitamin B deficiency recheck levels on oral B12  GERD is controlled  Adult wellness visit Routine visit with focus on health maintenance, vaccinations, and screenings. - Perform urine and blood work. - Administer flu shot. - Discuss COVID-19 booster eligibility and recommend based on high-risk status. - Advise to schedule mammogram.   Follow up: 6 mo for recheck Orders Placed This Encounter  Procedures   Flu vaccine HIGH DOSE PF(Fluzone Trivalent)   TSH   VITAMIN D  25 Hydroxy (Vit-D Deficiency, Fractures)   CBC with Differential/Platelet   Comprehensive metabolic panel with GFR   Lipid panel   Vitamin B12   Hemoglobin A1c   Microalbumin / creatinine urine ratio   Meds ordered  this encounter  Medications   losartan -hydrochlorothiazide (HYZAAR) 100-12.5 MG tablet    Sig: Take 1 tablet by mouth daily.    Dispense:  90 tablet    Refill:  3   metoprolol  tartrate (LOPRESSOR ) 50 MG tablet    Sig: Take 1 tablet (50 mg total) by mouth 2 (two) times daily.    Dispense:  180 tablet    Refill:  3      Body mass index is 30.19 kg/m. Wt Readings from Last 3 Encounters:  08/02/24 159 lb 12.8 oz (72.5 kg)  01/29/24 157 lb 9.6 oz (71.5 kg)  07/28/23 166 lb 3.2 oz (75.4 kg)     Patient Active Problem List   Diagnosis Date Noted   Mixed hyperlipidemia 01/29/2018    Priority: High   Type 2 diabetes mellitus with peripheral neuropathy (HCC) 01/17/2011    Priority: High   Hypertension associated with diabetes (HCC) 01/17/2011    Priority: High   Acquired hypothyroidism 01/17/2011    Priority: High   Osteopenia of forearm 06/20/2023    Priority: Medium     DEXA 05/2023: lowest T = - 1.5 at forearm, all other sites WNL. Recheck 3-5 years.    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  Priority: Medium     Overview:  righ 1st MCP joint    GERD (gastroesophageal reflux disease) 01/17/2011    Priority: Medium    Vitamin B12 deficiency 02/05/2024    Priority: Low    01/2024    Allergic rhinitis 01/12/2010    Priority: Low    Overview:  10/1 IMO update    Tricuspid regurgitation 01/12/2010    Priority: Low    Overview:  Mild Tricuspid Regurgitation - echo at [facility]; no further eval needed    Health Maintenance  Topic Date Due   Medicare Annual Wellness (AWV)  Never done   Diabetic kidney evaluation - Urine ACR  Never done   Mammogram  11/16/2023   Diabetic kidney evaluation - eGFR measurement  07/27/2024   HEMOGLOBIN A1C  07/31/2024   COVID-19 Vaccine (5 - 2025-26 season) 08/18/2024  (Originally 07/08/2024)   OPHTHALMOLOGY EXAM  02/25/2025   Fecal DNA (Cologuard)  04/22/2025   FOOT EXAM  08/02/2025   DEXA SCAN  06/01/2028   Pneumococcal Vaccine: 50+ Years  Completed   Influenza Vaccine  Completed   Hepatitis C Screening  Completed   Zoster Vaccines- Shingrix   Completed   HPV VACCINES  Aged Out   Meningococcal B Vaccine  Aged Out   DTaP/Tdap/Td  Discontinued   Hepatitis B Vaccines 19-59 Average Risk  Discontinued   Colonoscopy  Discontinued   Immunization History  Administered Date(s) Administered   Fluad Trivalent(High Dose 65+) 07/28/2023   Hepatitis B, ADULT 02/28/1991   Hepatitis B, PED/ADOLESCENT 02/28/1991   INFLUENZA, HIGH DOSE SEASONAL PF 08/02/2024   Influenza Inj Mdck Quad With Preservative 08/25/2017   Influenza Split 08/07/2000, 08/25/2011, 08/23/2015   Influenza, Seasonal, Injecte, Preservative Fre 08/26/2014, 08/22/2015, 09/03/2016   Influenza,inj,Quad PF,6+ Mos 08/25/2017, 08/01/2018, 08/08/2019, 07/15/2020, 08/06/2022   Influenza-Unspecified 08/07/2021   PFIZER(Purple Top)SARS-COV-2 Vaccination 01/08/2020, 01/29/2020, 07/31/2020   PNEUMOCOCCAL CONJUGATE-20 06/24/2022   PPD Test 11/08/2007, 02/28/2022, 03/13/2023   Pfizer Covid-19 Vaccine Bivalent Booster 60yrs & up 10/13/2021   Pneumococcal Conjugate-13 03/04/2014   Pneumococcal Polysaccharide-23 01/12/2010   Td 07/08/2005   Tdap 01/19/2012   Zoster Recombinant(Shingrix ) 11/14/2018, 05/07/2019   We updated and reviewed the patient's past history in detail and it is documented below. Allergies: Patient is allergic to lisinopril. Past Medical History Patient  has a past medical history of Arthritis (2005), Diabetes mellitus (HCC), GERD (gastroesophageal reflux disease) (2005), Hyperlipidemia, Hypertension, Normocytic anemia (08/03/2023), Thyroid  disease (2005), and Tuberculosis. Past Surgical History Patient  has a past surgical history that includes Cesarean section and Tubal ligation  (1990). Family History: Patient family history includes Arthritis in her maternal grandmother and mother; Breast cancer in her maternal aunt; Diabetes in her father; Healthy in her daughter and daughter; Hypertension in her father; Rectal cancer in her mother; Stroke in her father and maternal grandfather. Social History:  Patient  reports that she has never smoked. She has never used smokeless tobacco. She reports current alcohol use. She reports that she does not use drugs.  Review of Systems: Constitutional: negative for fever or malaise Ophthalmic: negative for photophobia, double vision or loss of vision Cardiovascular: negative for chest pain, dyspnea on exertion, or new LE swelling Respiratory: negative for SOB or persistent cough Gastrointestinal: negative for abdominal pain, change in bowel habits or melena Genitourinary: negative for dysuria or gross hematuria, no abnormal uterine bleeding or disharge Musculoskeletal: negative for new gait disturbance or muscular weakness Integumentary: negative for new or persistent rashes, no breast lumps Neurological: negative for  TIA or stroke symptoms Psychiatric: negative for SI or delusions Allergic/Immunologic: negative for hives  Patient Care Team    Relationship Specialty Notifications Start End  Jodie Lavern CROME, MD PCP - General Family Medicine  01/29/18   Octavia Bruckner, MD Consulting Physician Ophthalmology  01/29/18   Cheryll Siva    01/29/18    Comment: Dentist  Donnald Charleston, MD Consulting Physician Gastroenterology  03/25/20     Objective  Vitals: BP 128/60   Pulse 70   Temp 97.7 F (36.5 C)   Ht 5' 1 (1.549 m)   Wt 159 lb 12.8 oz (72.5 kg)   SpO2 97%   BMI 30.19 kg/m  General:  Well developed, well nourished, no acute distress  Psych:  Alert and orientedx3,normal mood and affect HEENT:  Normocephalic, atraumatic, non-icteric sclera,  supple neck without adenopathy, mass or thyromegaly Cardiovascular:  Normal  S1, S2, RRR without gallop, rub or murmur Respiratory:  Good breath sounds bilaterally, CTAB with normal respiratory effort Gastrointestinal: normal bowel sounds, soft, non-tender, no noted masses. No HSM MSK: extremities without edema, joints without erythema or swelling Left knee: Tender medial joint line.  Negative McMurray's negative Lachman's mild crepitus normal range of motion Neurologic:    Mental status is normal.  Gross motor and sensory exams are normal.  No tremor Diabetic Foot Exam: Appearance - no lesions, ulcers or significant calluses Skin - no sigificant pallor or erythema Normal sensation Pulses - +2 distally bilaterally  Commons side effects, risks, benefits, and alternatives for medications and treatment plan prescribed today were discussed, and the patient expressed understanding of the given instructions. Patient is instructed to call or message via MyChart if he/she has any questions or concerns regarding our treatment plan. No barriers to understanding were identified. We discussed Red Flag symptoms and signs in detail. Patient expressed understanding regarding what to do in case of urgent or emergency type symptoms.  Medication list was reconciled, printed and provided to the patient in AVS. Patient instructions and summary information was reviewed with the patient as documented in the AVS. This note was prepared with assistance of Dragon voice recognition software. Occasional wrong-word or sound-a-like substitutions may have occurred due to the inherent limitations of voice recognition software

## 2024-08-02 NOTE — Patient Instructions (Signed)
 Please return in 6 months to recheck diabetes and blood pressure   I will release your lab results to you on your MyChart account with further instructions. You may see the results before I do, but when I review them I will send you a message with my report or have my assistant call you if things need to be discussed. Please reply to my message with any questions. Thank you!   If you have any questions or concerns, please don't hesitate to send me a message via MyChart or call the office at [phone]. Thank you for visiting with us  today! It's our pleasure caring for you.    VISIT SUMMARY: Today, you had a physical exam and follow-up for your ongoing medical conditions. We discussed your diabetes, hypertension, hyperlipidemia, left knee pain, peripheral neuropathy, and preventive health maintenance. You also received your influenza vaccination.  YOUR PLAN: -LEFT KNEE PAIN WITH MENISCUS TEAR AND OSTEOARTHRITIS: Your left knee pain is likely due to a combination of a meniscus tear and arthritis. To help manage this, you should do quadriceps strengthening exercises like straight leg raises, wear a knee brace during activities that stress your knee, and ice your knee if it becomes bothersome. If your symptoms get worse, consider physical therapy.  -TYPE 2 DIABETES MELLITUS: Your diabetes is well-controlled with no symptoms of high blood sugar. Continue with your current management plan.  -PERIPHERAL NEUROPATHY: Peripheral neuropathy causes occasional numbness and tingling in your extremities. You are taking vitamin B12 supplements, and we will check your vitamin B12 levels to ensure they are adequate.  -MIXED HYPERLIPIDEMIA: Mixed hyperlipidemia means you have high levels of different types of fats in your blood. Continue with your current treatment plan to manage this condition.  -HYPOTHYROIDISM: Hypothyroidism is when your thyroid  gland does not produce enough hormones. Continue with your current  treatment plan to manage this condition.  -VITAMIN B DEFICIENCY: Vitamin B deficiency means you have low levels of vitamin B in your body. Continue taking your vitamin B12 supplements.  -ADULT WELLNESS VISIT: This was a routine visit focused on maintaining your overall health. We performed urine and blood work, administered your flu shot, discussed your eligibility for a COVID-19 booster, and advised you to schedule a mammogram.  INSTRUCTIONS: Please schedule a mammogram as soon as possible. We will check your vitamin B12 levels. Continue with your current management plans for diabetes, hypertension, hyperlipidemia, hypothyroidism, and vitamin B deficiency. If your knee pain worsens, consider physical therapy.                      Contains text generated by Abridge.                                 Contains text generated by Abridge.

## 2024-08-11 ENCOUNTER — Ambulatory Visit: Payer: Self-pay | Admitting: Family Medicine

## 2024-08-11 NOTE — Progress Notes (Signed)
 See mychart note Dear Ms. Rubiano, Your diabetic control is slightly worsening again. Can you improve your diet? Or do we need to discuss adding another medication? And your b12 level remains low.  If you are taking an otc supplement daily, then we need to change you over to injections at the office. Let me know your thoughts and we should have you back in the office in 3 months to recheck your diabetes.  Sincerely, Dr. Jodie

## 2024-08-12 ENCOUNTER — Encounter: Payer: Self-pay | Admitting: Family Medicine

## 2024-08-13 NOTE — Telephone Encounter (Signed)
 Keeps going to VM. I will reach out to pt again

## 2024-08-14 NOTE — Telephone Encounter (Signed)
 I have reached out to pt on several different occasions and it keeps going to VM

## 2024-08-15 NOTE — Telephone Encounter (Signed)
 LVM for pt to contact the office to get scheduled to receive B12 injections

## 2024-08-20 ENCOUNTER — Ambulatory Visit (INDEPENDENT_AMBULATORY_CARE_PROVIDER_SITE_OTHER)

## 2024-08-20 ENCOUNTER — Encounter: Payer: Self-pay | Admitting: Family Medicine

## 2024-08-20 DIAGNOSIS — E538 Deficiency of other specified B group vitamins: Secondary | ICD-10-CM | POA: Diagnosis not present

## 2024-08-20 MED ORDER — CYANOCOBALAMIN 1000 MCG/ML IJ SOLN
1000.0000 ug | Freq: Once | INTRAMUSCULAR | Status: AC
  Administered 2024-08-20: 1000 ug via INTRAMUSCULAR

## 2024-08-20 NOTE — Progress Notes (Signed)
.  Patient is in office today for a nurse visit for B12 Injection. Patient Injection was given in the  Left deltoid. Patient tolerated injection well. Per Dr jodie. Pt advised to schedule next injection

## 2024-08-28 ENCOUNTER — Ambulatory Visit

## 2024-09-11 ENCOUNTER — Ambulatory Visit

## 2024-09-12 ENCOUNTER — Ambulatory Visit

## 2024-09-12 DIAGNOSIS — E538 Deficiency of other specified B group vitamins: Secondary | ICD-10-CM

## 2024-09-12 MED ORDER — CYANOCOBALAMIN 1000 MCG/ML IJ SOLN
1000.0000 ug | Freq: Once | INTRAMUSCULAR | Status: AC
  Administered 2024-09-12: 1000 ug via INTRAMUSCULAR

## 2024-09-12 NOTE — Progress Notes (Signed)
 Patient is in office today for a nurse visit for B12 Injection. Patient Injection was given in the  Left deltoid. Patient tolerated injection well. Patient states no questions or concerns today.

## 2024-10-13 ENCOUNTER — Other Ambulatory Visit: Payer: Self-pay | Admitting: Family Medicine

## 2024-10-15 ENCOUNTER — Ambulatory Visit

## 2024-10-15 DIAGNOSIS — E538 Deficiency of other specified B group vitamins: Secondary | ICD-10-CM | POA: Diagnosis not present

## 2024-10-15 MED ORDER — CYANOCOBALAMIN 1000 MCG/ML IJ SOLN
1000.0000 ug | Freq: Once | INTRAMUSCULAR | Status: AC
  Administered 2024-10-15: 1000 ug via INTRAMUSCULAR

## 2024-10-15 NOTE — Progress Notes (Signed)
 Patient is in office today for a nurse visit for B12 Injection. Patient Injection was given in the  Right deltoid. Patient tolerated injection well.

## 2024-10-17 NOTE — Progress Notes (Signed)
 Chanique Duca                                          MRN: [id number]   10/17/2024   The [facility] Quality Team Specialist reviewed this patient medical record for the purposes of chart review for care gap closure. The following were reviewed: abstraction for care gap closure-kidney health evaluation for diabetes:eGFR  and uACR.    [facility] Quality Team

## 2024-11-19 ENCOUNTER — Ambulatory Visit (INDEPENDENT_AMBULATORY_CARE_PROVIDER_SITE_OTHER)

## 2024-11-19 DIAGNOSIS — E538 Deficiency of other specified B group vitamins: Secondary | ICD-10-CM

## 2024-11-19 MED ORDER — CYANOCOBALAMIN 1000 MCG/ML IJ SOLN
1000.0000 ug | Freq: Once | INTRAMUSCULAR | Status: AC
  Administered 2024-11-19: 1000 ug via INTRAMUSCULAR

## 2024-11-19 NOTE — Progress Notes (Signed)
 Patient is in office today for a nurse visit for B12 Injection. Patient Injection was given in the  Right deltoid. Patient tolerated injection well.

## 2024-12-24 ENCOUNTER — Ambulatory Visit

## 2024-12-25 ENCOUNTER — Ambulatory Visit

## 2025-01-31 ENCOUNTER — Ambulatory Visit: Admitting: Family Medicine
